# Patient Record
Sex: Female | Born: 1960 | Race: Black or African American | Hispanic: No | Smoking: Former smoker
Health system: Southern US, Community
[De-identification: ages and names within clinical notes are randomized; demographics above are authoritative.]

## PROBLEM LIST (undated history)

## (undated) DIAGNOSIS — Z5189 Encounter for other specified aftercare: Secondary | ICD-10-CM

## (undated) DIAGNOSIS — K649 Unspecified hemorrhoids: Secondary | ICD-10-CM

## (undated) DIAGNOSIS — E785 Hyperlipidemia, unspecified: Secondary | ICD-10-CM

## (undated) DIAGNOSIS — D649 Anemia, unspecified: Secondary | ICD-10-CM

## (undated) DIAGNOSIS — M1990 Unspecified osteoarthritis, unspecified site: Secondary | ICD-10-CM

## (undated) DIAGNOSIS — T7840XA Allergy, unspecified, initial encounter: Secondary | ICD-10-CM

## (undated) DIAGNOSIS — R011 Cardiac murmur, unspecified: Secondary | ICD-10-CM

## (undated) HISTORY — DX: Anemia, unspecified: D64.9

## (undated) HISTORY — PX: POLYPECTOMY: SHX149

## (undated) HISTORY — DX: Unspecified osteoarthritis, unspecified site: M19.90

## (undated) HISTORY — DX: Unspecified hemorrhoids: K64.9

## (undated) HISTORY — DX: Hyperlipidemia, unspecified: E78.5

## (undated) HISTORY — DX: Allergy, unspecified, initial encounter: T78.40XA

## (undated) HISTORY — DX: Cardiac murmur, unspecified: R01.1

## (undated) HISTORY — PX: TONSILLECTOMY: SUR1361

## (undated) HISTORY — PX: HERNIA REPAIR: SHX51

## (undated) HISTORY — DX: Encounter for other specified aftercare: Z51.89

---

## 2012-11-09 ENCOUNTER — Ambulatory Visit (INDEPENDENT_AMBULATORY_CARE_PROVIDER_SITE_OTHER): Payer: BC Managed Care – PPO | Admitting: Emergency Medicine

## 2012-11-09 VITALS — BP 126/76 | HR 80 | Temp 98.3°F | Resp 16 | Ht 68.25 in | Wt 230.6 lb

## 2012-11-09 DIAGNOSIS — M67442 Ganglion, left hand: Secondary | ICD-10-CM

## 2012-11-09 DIAGNOSIS — M6740 Ganglion, unspecified site: Secondary | ICD-10-CM

## 2012-11-09 MED ORDER — NAPROXEN SODIUM 550 MG PO TABS: 550.0000 mg | ORAL_TABLET | Freq: Two times a day (BID) | ORAL | Status: AC

## 2012-11-09 NOTE — Patient Instructions (Signed)
Ganglion Cyst °A ganglion cyst is a noncancerous, fluid-filled lump that occurs near joints or tendons. The ganglion cyst grows out of a joint or the lining of a tendon. It most often develops in the hand or wrist but can also develop in the shoulder, elbow, hip, knee, ankle, or foot. The round or oval ganglion can be pea sized or larger than a grape. Increased activity may enlarge the size of the cyst because more fluid starts to build up.  °CAUSES  °It is not completely known what causes a ganglion cyst to grow. However, it may be related to: °· Inflammation or irritation around the joint. °· An injury. °· Repetitive movements or overuse. °· Arthritis. °SYMPTOMS  °A lump most often appears in the hand or wrist, but can occur in other areas of the body. Generally, the lump is painless without other symptoms. However, sometimes pain can be felt during activity or when pressure is applied to the lump. The lump may even be tender to the touch. Tingling, pain, numbness, or muscle weakness can occur if the ganglion cyst presses on a nerve. Your grip may be weak and you may have less movement in your joints.  °DIAGNOSIS  °Ganglion cysts are most often diagnosed based on a physical exam, noting where the cyst is and how it looks. Your caregiver will feel the lump and may shine a light alongside it. If it is a ganglion, a light often shines through it. Your caregiver may order an X-ray, ultrasound, or MRI to rule out other conditions. °TREATMENT  °Ganglions usually go away on their own without treatment. If pain or other symptoms are involved, treatment may be needed. Treatment is also needed if the ganglion limits your movement or if it gets infected. Treatment options include: °· Wearing a wrist or finger brace or splint. °· Taking anti-inflammatory medicine. °· Draining fluid from the lump with a needle (aspiration). °· Injecting a steroid into the joint. °· Surgery to remove the ganglion cyst and its stalk that is  attached to the joint or tendon. However, ganglion cysts can grow back. °HOME CARE INSTRUCTIONS  °· Do not press on the ganglion, poke it with a needle, or hit it with a heavy object. You may rub the lump gently and often. Sometimes fluid moves out of the cyst. °· Only take medicines as directed by your caregiver. °· Wear your brace or splint as directed by your caregiver. °SEEK MEDICAL CARE IF:  °· Your ganglion becomes larger or more painful. °· You have increased redness, red streaks, or swelling. °· You have pus coming from the lump. °· You have weakness or numbness in the affected area. °MAKE SURE YOU:  °· Understand these instructions. °· Will watch your condition. °· Will get help right away if you are not doing well or get worse. °Document Released: 12/25/1999 Document Revised: 09/21/2011 Document Reviewed: 02/20/2007 °ExitCare® Patient Information ©2014 [employer]. ° °

## 2012-11-09 NOTE — Progress Notes (Signed)
[facility] [address] [place] [address] [phone]  Date:  11/09/2012   Name:  Marissa Rasmussen   DOB:  1960/02/12   MRN:  [id number]  PCP:  No primary provider on file.    Chief Complaint: Mass   History of Present Illness:  Marissa Rasmussen is a 52 y.o. very pleasant female patient who presents with the following:  Patient to office with pain in her left dorsal hand.  No history of trauma or overuse.  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   Has a swelling in the hand corresponding to the pain.  Woke up with pain in middle of  The night..  No improvement with over the counter medications or other home remedies. Denies other complaint or health concern today.   There are no active problems to display for this patient.   Past Medical History  Diagnosis Date  . Allergy   . Anemia   . Anxiety   . Blood transfusion without reported diagnosis     Past Surgical History  Procedure Laterality Date  . Cesarean section    . Hernia repair    . Tonsillectomy    . Polypectomy      attached to stomach wall    History  Substance Use Topics  . Smoking status: Never Smoker   . Smokeless tobacco: Not on file  . Alcohol Use: 0.0 oz/week    1-2 drink(s) per week    Family History  Problem Relation Age of Onset  . Cancer Mother     Bowel duct  . Hypertension Mother   . Stroke Mother   . Hypertension Father   . Diabetes Father   . Heart disease Father   . Heart disease Sister   . Hypertension Sister   . Cancer Maternal Grandmother   . Heart disease Paternal Grandmother   . Cancer Paternal Grandfather   . Hypertension Sister     No Known Allergies  Medication list has been reviewed and updated.  No current outpatient prescriptions on file prior to visit.   No current facility-administered medications on file prior to visit.    Review of Systems:  As per HPI, otherwise negative.    Physical Examination: Filed Vitals:   11/09/12 1003   BP: 126/76  Pulse: 80  Temp: 98.3 F (36.8 C)  Resp: 16   Filed Vitals:   11/09/12 1003  Height: 5' 8.25" (1.734 m)  Weight: 230 lb 9.6 oz (104.599 kg)   Body mass index is 34.79 kg/(m^2). Ideal Body Weight: Weight in (lb) to have BMI = 25: 165.3   GEN: WDWN, NAD, Non-toxic, Alert & Oriented x 3 HEENT: Atraumatic, Normocephalic.  Ears and Nose: No external deformity. EXTR: No clubbing/cyanosis/edema NEURO: Normal gait.  PSYCH: Normally interactive. Conversant. Not depressed or anxious appearing.  Calm demeanor.  LEFT hand:  Has tender ballotable mass on dorsal hand no ecchymosis or osseous tenderness.  FULL AROM.  NATI.  Assessment and Plan: Ganglion hand Anaprox Hand consult Splint   Signed,  Phillips Odor, MD

## 2014-03-05 ENCOUNTER — Ambulatory Visit (INDEPENDENT_AMBULATORY_CARE_PROVIDER_SITE_OTHER): Payer: BC Managed Care – PPO | Admitting: Family Medicine

## 2014-03-05 ENCOUNTER — Encounter: Payer: Self-pay | Admitting: Family Medicine

## 2014-03-05 VITALS — BP 122/70 | HR 84 | Temp 97.7°F | Resp 16 | Ht 68.0 in | Wt 240.6 lb

## 2014-03-05 DIAGNOSIS — Z1159 Encounter for screening for other viral diseases: Secondary | ICD-10-CM

## 2014-03-05 DIAGNOSIS — Z124 Encounter for screening for malignant neoplasm of cervix: Secondary | ICD-10-CM

## 2014-03-05 DIAGNOSIS — M25561 Pain in right knee: Secondary | ICD-10-CM

## 2014-03-05 DIAGNOSIS — K642 Third degree hemorrhoids: Secondary | ICD-10-CM

## 2014-03-05 DIAGNOSIS — N6012 Diffuse cystic mastopathy of left breast: Secondary | ICD-10-CM

## 2014-03-05 DIAGNOSIS — Z13220 Encounter for screening for lipoid disorders: Secondary | ICD-10-CM

## 2014-03-05 DIAGNOSIS — N898 Other specified noninflammatory disorders of vagina: Secondary | ICD-10-CM

## 2014-03-05 DIAGNOSIS — N6011 Diffuse cystic mastopathy of right breast: Secondary | ICD-10-CM

## 2014-03-05 DIAGNOSIS — M25562 Pain in left knee: Secondary | ICD-10-CM

## 2014-03-05 DIAGNOSIS — Z1212 Encounter for screening for malignant neoplasm of rectum: Secondary | ICD-10-CM

## 2014-03-05 DIAGNOSIS — E669 Obesity, unspecified: Secondary | ICD-10-CM | POA: Insufficient documentation

## 2014-03-05 DIAGNOSIS — J309 Allergic rhinitis, unspecified: Secondary | ICD-10-CM | POA: Insufficient documentation

## 2014-03-05 DIAGNOSIS — Z01419 Encounter for gynecological examination (general) (routine) without abnormal findings: Secondary | ICD-10-CM

## 2014-03-05 DIAGNOSIS — Z113 Encounter for screening for infections with a predominantly sexual mode of transmission: Secondary | ICD-10-CM

## 2014-03-05 DIAGNOSIS — Z0000 Encounter for general adult medical examination without abnormal findings: Secondary | ICD-10-CM

## 2014-03-05 DIAGNOSIS — Z1211 Encounter for screening for malignant neoplasm of colon: Secondary | ICD-10-CM

## 2014-03-05 LAB — CBC WITH DIFFERENTIAL/PLATELET
BASOS ABS: 0 10*3/uL (ref 0.0–0.1)
BASOS PCT: 0 % (ref 0–1)
EOS ABS: 0.1 10*3/uL (ref 0.0–0.7)
Eosinophils Relative: 1 % (ref 0–5)
HCT: 42.4 % (ref 36.0–46.0)
Hemoglobin: 14.4 g/dL (ref 12.0–15.0)
LYMPHS PCT: 32 % (ref 12–46)
Lymphs Abs: 2.5 10*3/uL (ref 0.7–4.0)
MCH: 28.1 pg (ref 26.0–34.0)
MCHC: 34 g/dL (ref 30.0–36.0)
MCV: 82.8 fL (ref 78.0–100.0)
MONO ABS: 0.5 10*3/uL (ref 0.1–1.0)
MPV: 11.7 fL (ref 8.6–12.4)
Monocytes Relative: 7 % (ref 3–12)
Neutro Abs: 4.6 10*3/uL (ref 1.7–7.7)
Neutrophils Relative %: 60 % (ref 43–77)
PLATELETS: 357 10*3/uL (ref 150–400)
RBC: 5.12 MIL/uL — AB (ref 3.87–5.11)
RDW: 13.5 % (ref 11.5–15.5)
WBC: 7.7 10*3/uL (ref 4.0–10.5)

## 2014-03-05 LAB — TSH: TSH: 1.144 u[IU]/mL (ref 0.350–4.500)

## 2014-03-05 LAB — COMPLETE METABOLIC PANEL WITH GFR
ALK PHOS: 114 U/L (ref 39–117)
ALT: 16 U/L (ref 0–35)
AST: 15 U/L (ref 0–37)
Albumin: 4.5 g/dL (ref 3.5–5.2)
BILIRUBIN TOTAL: 0.6 mg/dL (ref 0.2–1.2)
BUN: 14 mg/dL (ref 6–23)
CO2: 19 mEq/L (ref 19–32)
CREATININE: 1.11 mg/dL — AB (ref 0.50–1.10)
Calcium: 9.8 mg/dL (ref 8.4–10.5)
Chloride: 108 mEq/L (ref 96–112)
GFR, EST AFRICAN AMERICAN: 66 mL/min
GFR, EST NON AFRICAN AMERICAN: 57 mL/min — AB
Glucose, Bld: 87 mg/dL (ref 70–99)
POTASSIUM: 4.3 meq/L (ref 3.5–5.3)
Sodium: 142 mEq/L (ref 135–145)
TOTAL PROTEIN: 7.8 g/dL (ref 6.0–8.3)

## 2014-03-05 LAB — LIPID PANEL
CHOLESTEROL: 248 mg/dL — AB (ref 0–200)
HDL: 37 mg/dL — ABNORMAL LOW (ref >=46)
LDL Cholesterol: 179 mg/dL — ABNORMAL HIGH (ref 0–99)
Total CHOL/HDL Ratio: 6.7 Ratio
Triglycerides: 162 mg/dL — ABNORMAL HIGH (ref <150)
VLDL: 32 mg/dL (ref 0–40)

## 2014-03-05 LAB — RPR

## 2014-03-05 LAB — POCT URINALYSIS DIPSTICK
BILIRUBIN UA: NEGATIVE
GLUCOSE UA: NEGATIVE
Ketones, UA: NEGATIVE
LEUKOCYTES UA: NEGATIVE
Nitrite, UA: NEGATIVE
PH UA: 5
Protein, UA: NEGATIVE
RBC UA: NEGATIVE
Spec Grav, UA: 1.025
UROBILINOGEN UA: 0.2

## 2014-03-05 LAB — POCT WET PREP WITH KOH
KOH Prep POC: NEGATIVE
RBC Wet Prep HPF POC: NEGATIVE
TRICHOMONAS UA: NEGATIVE
Yeast Wet Prep HPF POC: NEGATIVE

## 2014-03-05 MED ORDER — LORATADINE 10 MG PO TABS: 10.0000 mg | ORAL_TABLET | Freq: Every day | ORAL | Status: DC

## 2014-03-05 MED ORDER — METRONIDAZOLE 500 MG PO TABS: 500.0000 mg | ORAL_TABLET | Freq: Two times a day (BID) | ORAL | Status: DC

## 2014-03-05 MED ORDER — IBUPROFEN 200 MG PO TABS: ORAL_TABLET | ORAL | Status: AC

## 2014-03-05 MED ORDER — FLUTICASONE PROPIONATE 50 MCG/ACT NA SUSP: 2.0000 | Freq: Every day | NASAL | Status: DC

## 2014-03-05 NOTE — Patient Instructions (Addendum)
Keeping You Healthy  Get These Tests  Blood Pressure- Have your blood pressure checked by your healthcare provider at least once a year.  Normal blood pressure is 120/80.  Weight- Have your body mass index (BMI) calculated to screen for obesity.  BMI is a measure of body fat based on height and weight.  You can calculate your own BMI at GravelBags.it  Cholesterol- Have your cholesterol checked every year.  Diabetes- Have your blood sugar checked every year if you have high blood pressure, high cholesterol, a family history of diabetes or if you are overweight.  Pap Smear- Have a pap smear every 1 to 3 years if you have been sexually active.  If you are older than 65 and recent pap smears have been normal you may not need additional pap smears.  In addition, if you have had a hysterectomy  For benign disease additional pap smears are not necessary.  Mammogram-Yearly mammograms are essential for early detection of breast cancer  Screening for Colon Cancer- Colonoscopy starting at age 66. Screening may begin sooner depending on your family history and other health conditions.  Follow up colonoscopy as directed by your Gastroenterologist.  Screening for Osteoporosis- Screening begins at age 53 with bone density scanning, sooner if you are at higher risk for developing Osteoporosis.  Get these medicines  Calcium with Vitamin D- Your body requires 1200-1500 mg of Calcium a day and [phone] IU of Vitamin D a day.  You can only absorb 500 mg of Calcium at a time therefore Calcium must be taken in 2 or 3 separate doses throughout the day.  Hormones- Hormone therapy has been associated with increased risk for certain cancers and heart disease.  Talk to your healthcare provider about if you need relief from menopausal symptoms.  Aspirin- Ask your healthcare provider about taking Aspirin to prevent Heart Disease and Stroke.  Get these Immuniztions  Flu shot- Every fall You declined this  vaccine due to adverse reaction in the past.  Pneumonia shot- Once after the age of 69; if you are younger ask your healthcare provider if you need a pneumonia shot.  Tetanus- Every ten years. Next due- year 2022.  Zostavax- Once after the age of 74 to prevent shingles.  Take these steps  Don't smoke- Your healthcare provider can help you quit. For tips on how to quit, ask your healthcare provider or go to www.smokefree.gov or call 1-800 QUIT-NOW.  Be physically active- Exercise 5 days a week for a minimum of 30 minutes.  If you are not already physically active, start slow and gradually work up to 30 minutes of moderate physical activity.  Try walking, dancing, bike riding, swimming, etc.  Eat a healthy diet- Eat a variety of healthy foods such as fruits, vegetables, whole grains, low fat milk, low fat cheeses, yogurt, lean meats, chicken, fish, eggs, dried beans, tofu, etc.  For more information go to www.thenutritionsource.org  Dental visit- Brush and floss teeth twice daily; visit your dentist twice a year.  Eye exam- Visit your Optometrist or Ophthalmologist yearly.  Drink alcohol in moderation- Limit alcohol intake to one drink or less a day.  Never drink and drive.  Depression- Your emotional health is as important as your physical health.  If you're feeling down or losing interest in things you normally enjoy, please talk to your healthcare provider.  Seat Belts- can save your life; always wear one  Smoke/Carbon Monoxide detectors- These detectors need to be installed on the appropriate level  of your home.  Replace batteries at least once a year.  Violence- If anyone is threatening or hurting you, please tell your healthcare provider.  Living Will/ Health care power of attorney- Discuss with your healthcare provider and family.    Knee Pain The knee is the complex joint between your thigh and your lower leg. It is made up of bones, tendons, ligaments, and cartilage. The  bones that make up the knee are:  The femur in the thigh.  The tibia and fibula in the lower leg.  The patella or kneecap riding in the groove on the lower femur. CAUSES  Knee pain is a common complaint with many causes. A few of these causes are:  Injury, such as:  A ruptured ligament or tendon injury.  Torn cartilage.  Medical conditions, such as:  Gout  Arthritis  Infections  Overuse, over training, or overdoing a physical activity. Knee pain can be minor or severe. Knee pain can accompany debilitating injury. Minor knee problems often respond well to self-care measures or get well on their own. More serious injuries may need medical intervention or even surgery. SYMPTOMS The knee is complex. Symptoms of knee problems can vary widely. Some of the problems are:  Pain with movement and weight bearing.  Swelling and tenderness.  Buckling of the knee.  Inability to straighten or extend your knee.  Your knee locks and you cannot straighten it.  Warmth and redness with pain and fever.  Deformity or dislocation of the kneecap. DIAGNOSIS  Determining what is wrong may be very straight forward such as when there is an injury. It can also be challenging because of the complexity of the knee. Tests to make a diagnosis may include:  Your caregiver taking a history and doing a physical exam.  Routine X-rays can be used to rule out other problems. X-rays will not reveal a cartilage tear. Some injuries of the knee can be diagnosed by:  Arthroscopy a surgical technique by which a small video camera is inserted through tiny incisions on the sides of the knee. This procedure is used to examine and repair internal knee joint problems. Tiny instruments can be used during arthroscopy to repair the torn knee cartilage (meniscus).  Arthrography is a radiology technique. A contrast liquid is directly injected into the knee joint. Internal structures of the knee joint then become visible  on X-ray film.  An MRI scan is a non X-ray radiology procedure in which magnetic fields and a computer produce two- or three-dimensional images of the inside of the knee. Cartilage tears are often visible using an MRI scanner. MRI scans have largely replaced arthrography in diagnosing cartilage tears of the knee.  Blood work.  Examination of the fluid that helps to lubricate the knee joint (synovial fluid). This is done by taking a sample out using a needle and a syringe. TREATMENT The treatment of knee problems depends on the cause. Some of these treatments are:  Depending on the injury, proper casting, splinting, surgery, or physical therapy care will be needed.  Give yourself adequate recovery time. Do not overuse your joints. If you begin to get sore during workout routines, back off. Slow down or do fewer repetitions.  For repetitive activities such as cycling or running, maintain your strength and nutrition.  Alternate muscle groups. For example, if you are a weight lifter, work the upper body on one day and the lower body the next.  Either tight or weak muscles do not give the  proper support for your knee. Tight or weak muscles do not absorb the stress placed on the knee joint. Keep the muscles surrounding the knee strong.  Take care of mechanical problems.  If you have flat feet, orthotics or special shoes may help. See your caregiver if you need help.  Arch supports, sometimes with wedges on the inner or outer aspect of the heel, can help. These can shift pressure away from the side of the knee most bothered by osteoarthritis.  A brace called an "unloader" brace also may be used to help ease the pressure on the most arthritic side of the knee.  If your caregiver has prescribed crutches, braces, wraps or ice, use as directed. The acronym for this is PRICE. This means protection, rest, ice, compression, and elevation.  Nonsteroidal anti-inflammatory drugs (NSAIDs), can help  relieve pain. But if taken immediately after an injury, they may actually increase swelling. Take NSAIDs with food in your stomach. Stop them if you develop stomach problems. Do not take these if you have a history of ulcers, stomach pain, or bleeding from the bowel. Do not take without your caregiver's approval if you have problems with fluid retention, heart failure, or kidney problems.  For ongoing knee problems, physical therapy may be helpful.  Glucosamine and chondroitin are over-the-counter dietary supplements. Both may help relieve the pain of osteoarthritis in the knee. These medicines are different from the usual anti-inflammatory drugs. Glucosamine may decrease the rate of cartilage destruction.  Injections of a corticosteroid drug into your knee joint may help reduce the symptoms of an arthritis flare-up. They may provide pain relief that lasts a few months. You may have to wait a few months between injections. The injections do have a small increased risk of infection, water retention, and elevated blood sugar levels.  Hyaluronic acid injected into damaged joints may ease pain and provide lubrication. These injections may work by reducing inflammation. A series of shots may give relief for as long as 6 months.  Topical painkillers. Applying certain ointments to your skin may help relieve the pain and stiffness of osteoarthritis. Ask your pharmacist for suggestions. Many over the-counter products are approved for temporary relief of arthritis pain.  In some countries, doctors often prescribe topical NSAIDs for relief of chronic conditions such as arthritis and tendinitis. A review of treatment with NSAID creams found that they worked as well as oral medications but without the serious side effects. PREVENTION  Maintain a healthy weight. Extra pounds put more strain on your joints.  Get strong, stay limber. Weak muscles are a common cause of knee injuries. Stretching is important. Include  flexibility exercises in your workouts.  Be smart about exercise. If you have osteoarthritis, chronic knee pain or recurring injuries, you may need to change the way you exercise. This does not mean you have to stop being active. If your knees ache after jogging or playing basketball, consider switching to swimming, water aerobics, or other low-impact activities, at least for a few days a week. Sometimes limiting high-impact activities will provide relief.  Make sure your shoes fit well. Choose footwear that is right for your sport.  Protect your knees. Use the proper gear for knee-sensitive activities. Use kneepads when playing volleyball or laying carpet. Buckle your seat belt every time you drive. Most shattered kneecaps occur in car accidents.  Rest when you are tired. SEEK MEDICAL CARE IF:  You have knee pain that is continual and does not seem to be getting better.  SEEK IMMEDIATE MEDICAL CARE IF:  Your knee joint feels hot to the touch and you have a high fever. MAKE SURE YOU:   Understand these instructions.  Will watch your condition.  Will get help right away if you are not doing well or get worse. Document Released: 10/24/2006 Document Revised: 03/21/2011 Document Reviewed: 10/24/2006 [facility] Patient Information 2015 [place], [place]. This information is not intended to replace advice given to you by your health care provider. Make sure you discuss any questions you have with your health care provider.    Bacterial Vaginosis Bacterial vaginosis is a vaginal infection that occurs when the normal balance of bacteria in the vagina is disrupted. It results from an overgrowth of certain bacteria. This is the most common vaginal infection in women of childbearing age. Treatment is important to prevent complications, especially in pregnant women, as it can cause a premature delivery. CAUSES  Bacterial vaginosis is caused by an increase in harmful bacteria that are normally present in  smaller amounts in the vagina. Several different kinds of bacteria can cause bacterial vaginosis. However, the reason that the condition develops is not fully understood. RISK FACTORS Certain activities or behaviors can put you at an increased risk of developing bacterial vaginosis, including:  Having a new sex partner or multiple sex partners.  Douching.  Using an intrauterine device (IUD) for contraception. Women do not get bacterial vaginosis from toilet seats, bedding, swimming pools, or contact with objects around them. SIGNS AND SYMPTOMS  Some women with bacterial vaginosis have no signs or symptoms. Common symptoms include:  Grey vaginal discharge.  A fishlike odor with discharge, especially after sexual intercourse.  Itching or burning of the vagina and vulva.  Burning or pain with urination. DIAGNOSIS  Your health care provider will take a medical history and examine the vagina for signs of bacterial vaginosis. A sample of vaginal fluid may be taken. Your health care provider will look at this sample under a microscope to check for bacteria and abnormal cells. A vaginal pH test may also be done.  TREATMENT  Bacterial vaginosis may be treated with antibiotic medicines. These may be given in the form of a pill or a vaginal cream. A second round of antibiotics may be prescribed if the condition comes back after treatment.  HOME CARE INSTRUCTIONS   Only take over-the-counter or prescription medicines as directed by your health care provider.  If antibiotic medicine was prescribed, take it as directed. Make sure you finish it even if you start to feel better.  Do not have sex until treatment is completed.  Tell all sexual partners that you have a vaginal infection. They should see their health care provider and be treated if they have problems, such as a mild rash or itching.  Practice safe sex by using condoms and only having one sex partner. SEEK MEDICAL CARE IF:   Your  symptoms are not improving after 3 days of treatment.  You have increased discharge or pain.  You have a fever. MAKE SURE YOU:   Understand these instructions.  Will watch your condition.  Will get help right away if you are not doing well or get worse. FOR MORE INFORMATION  Centers for Disease Control and Prevention, Division of STD Prevention: [employer]raiserFraud[employer] American Sexual Health Association (ASHA): www.ashastd.org  Document Released: 12/27/2004 Document Revised: 10/17/2012 Document Reviewed: 08/08/2012 [facility] Patient Information 2015 [place], [place]. This information is not intended to replace advice given to you by your health care provider. Make sure  you discuss any questions you have with your health care provider.

## 2014-03-05 NOTE — Progress Notes (Signed)
Subjective:    Patient ID: Marissa Rasmussen, female    DOB: 1960-12-04, 54 y.o.   MRN: [id number]  HPI  This 54 y.o. Female is here to establish care; last "Well Woman" exam was in 2014 in [place], [place]. She requests CPE/PAP and preventative care as well as referral for evaluation of chronic hemorrhoids at a center in [place], [place]. Pt has hx of fibrocystic breast changes and fibroid uterus.  She has 15-month hx of bilateral knee pain, R >> L. She had flare up when she tried to start a walk/run program. Pain accompanied by swelling and popping and sensation of "tear" in R knee. Some temporary relief w/ Advill 2 tabs every 4-6 hours for several days. Still had trouble climbing stairs and has not resumed walking for exercise. Requests letter/"prescription" for exercise so that will be covered by [employer] account; she wants to try water aerobics class.  HCM: PAP/MMG- As noted above.           CRS- Requests referral.            IMM- Declines Flu vaccine due to adverse reaction in the past.            Vision- Annually.            Dental- Currently in treatment.  Patient Active Problem List   Diagnosis Date Noted  . Third degree hemorrhoids 03/05/2014  . Fibrocystic breast changes of both breasts 03/05/2014  . Knee pain, bilateral 03/05/2014  . Obesity 03/05/2014  . Allergic rhinitis 03/05/2014    Prior to Admission medications   Medication Sig Start Date End Date Taking? Authorizing Provider  B Complex Vitamins (VITAMIN B COMPLEX PO) Take by mouth daily.   Yes Historical Provider, MD  Triamcinolone Acetonide (NASACORT AQ NA) Place 50 mcg into the nose. Nasal mist spray   Yes Historical Provider, MD  ibuprofen (ADVIL) 200 MG tablet Take 2 tablets by mouth every 6-8 hours prn pain.      loratadine (CLARITIN) 10 MG tablet Take 1 tablet (10 mg total) by mouth daily.       Past Surgical History  Procedure Laterality Date  . Hernia repair    . Tonsillectomy    . Polypectomy      attached to  stomach wall  . Cesarean section      per patient's [facility] - 2 cesarean sect    History   Social History  . Marital Status: Divorced    Spouse Name: N/A  . Number of Children: N/A  . Years of Education: N/A   Occupational History  . human resources    Social History Main Topics  . Smoking status: Never Smoker   . Smokeless tobacco: Not on file  . Alcohol Use: 0.0 oz/week    1-2 Standard drinks or equivalent per week  . Drug Use: No  . Sexual Activity: Not on file   Other Topics Concern  . Not on file   Social History Narrative    Family History  Problem Relation Age of Onset  . Cancer Mother     Bowel duct  . Hypertension Mother   . Stroke Mother   . Hyperlipidemia Mother   . Hypertension Father   . Diabetes Father   . Heart disease Father   . Hyperlipidemia Father   . Heart disease Sister   . Hypertension Sister   . Cancer Maternal Grandmother   . Heart disease Paternal Grandmother   . Cancer Paternal  Grandfather   . Hypertension Sister     Review of Systems  Constitutional: Positive for diaphoresis and activity change.       Night sweats due to menopause.  HENT: Positive for ear pain and sinus pressure.        Chronic allergies.  Eyes: Negative.        Wears corrective lenses; has appt today.  Respiratory: Negative.   Cardiovascular: Negative.   Gastrointestinal: Positive for abdominal pain, constipation, anal bleeding and rectal pain.       Associated w/ hemorrhoids.  Endocrine: Negative.   Genitourinary: Negative.        Menopause x 2 years.  Musculoskeletal: Positive for joint swelling and arthralgias. Negative for back pain and gait problem.       Knees. No hx of trauma.  Skin: Negative.   Allergic/Immunologic: Positive for environmental allergies.  Neurological: Negative.   Hematological: Negative.   Psychiatric/Behavioral: Negative.        Objective:   Physical Exam  Constitutional: She is oriented to person, place,  and time. Vital signs are normal. She appears well-developed and well-nourished. No distress.  Blood pressure 122/70, pulse 84, temperature 97.7 F (36.5 C), temperature source Oral, resp. rate 16, height 5\' 8"  (1.727 m), weight 240 lb 9.6 oz (109.135 kg), SpO2 98 %.   HENT:  Head: Normocephalic and atraumatic.  Right Ear: Hearing, tympanic membrane, external ear and ear canal normal.  Left Ear: Hearing, tympanic membrane, external ear and ear canal normal.  Nose: Nose normal. No nasal deformity or septal deviation.  Mouth/Throat: Uvula is midline, oropharynx is clear and moist and mucous membranes are normal. No oral lesions. Normal dentition.  Has popping in TMJs.  Eyes: Conjunctivae, EOM and lids are normal. Pupils are equal, round, and reactive to light. No scleral icterus.  Neck: Trachea normal, normal range of motion, full passive range of motion without pain and phonation normal. Neck supple. No spinous process tenderness and no muscular tenderness present. No thyroid mass and no thyromegaly present.  Cardiovascular: Normal rate, regular rhythm, S1 normal, S2 normal, normal heart sounds, intact distal pulses and normal pulses.   No extrasystoles are present. PMI is not displaced.  Exam reveals no gallop and no friction rub.   No murmur heard. Pulmonary/Chest: Effort normal and breath sounds normal. No respiratory distress. Right breast exhibits no inverted nipple, no mass, no nipple discharge, no skin change and no tenderness. Left breast exhibits no inverted nipple, no mass, no nipple discharge, no skin change and no tenderness. Breasts are symmetrical.  Abdominal: Soft. Normal appearance and bowel sounds are normal. She exhibits no distension and no mass. There is no hepatosplenomegaly. There is no tenderness. There is no guarding and no CVA tenderness.  Genitourinary: Rectal exam shows external hemorrhoid. There is no rash, tenderness or lesion on the right labia. There is no rash,  tenderness or lesion on the left labia. Uterus is enlarged. Uterus is not fixed and not tender. Cervix exhibits discharge. Cervix exhibits no motion tenderness and no friability. Right adnexum displays no mass, no tenderness and no fullness. Left adnexum displays no mass, no tenderness and no fullness. No erythema, tenderness or bleeding in the vagina. Vaginal discharge found.  Musculoskeletal:       Right shoulder: Normal.       Left shoulder: Normal.       Right knee: She exhibits decreased range of motion. She exhibits no swelling, no effusion, no deformity, normal alignment, no LCL laxity,  normal patellar mobility and normal meniscus.       Left knee: She exhibits decreased range of motion. She exhibits no swelling, no effusion, no deformity, normal alignment, normal patellar mobility and normal meniscus. No tenderness found.       Cervical back: Normal.       Thoracic back: Normal.       Lumbar back: Normal.  Mildly + patella compression test. McMurray's negative.  Lymphadenopathy:       Head (right side): No submental, no submandibular, no tonsillar, no preauricular, no posterior auricular and no occipital adenopathy present.       Head (left side): No submental, no submandibular, no tonsillar, no preauricular, no posterior auricular and no occipital adenopathy present.    She has no cervical adenopathy.    She has no axillary adenopathy.       Right: No inguinal and no supraclavicular adenopathy present.       Left: No inguinal and no supraclavicular adenopathy present.  Neurological: She is alert and oriented to person, place, and time. She has normal strength and normal reflexes. She displays no atrophy. No cranial nerve deficit or sensory deficit. She exhibits normal muscle tone. Coordination and gait normal.  Skin: Skin is warm, dry and intact. No ecchymosis, no lesion and no rash noted. She is not diaphoretic. No cyanosis or erythema. Nails show no clubbing.  Psychiatric: She has a  normal mood and affect. Her speech is normal. Judgment and thought content normal. Cognition and memory are normal.  Nursing note and vitals reviewed.   Results for orders placed or performed in visit on 03/05/14  POCT Wet Prep with KOH  Result Value Ref Range   Trichomonas, UA Negative    Clue Cells Wet Prep HPF POC tntc    Epithelial Wet Prep HPF POC tntc    Yeast Wet Prep HPF POC neg    Bacteria Wet Prep HPF POC 5+    RBC Wet Prep HPF POC neg    WBC Wet Prep HPF POC tntc    KOH Prep POC Negative   POCT urinalysis dipstick  Result Value Ref Range   Color, UA yellow    Clarity, UA clear    Glucose, UA neg    Bilirubin, UA neg    Ketones, UA neg    Spec Grav, UA 1.025    Blood, UA neg    pH, UA 5.0    Protein, UA neg    Urobilinogen, UA 0.2    Nitrite, UA neg    Leukocytes, UA Negative        Assessment & Plan:  Encounter for health maintenance examination - Plan: TSH, POCT urinalysis dipstick  Encounter for cervical Pap smear with pelvic exam - Plan: Pap IG, CT/NG w/ reflex HPV when ASC-U  Third degree hemorrhoids - Plan: Ambulatory referral to General Surgery (pt requests a practice in [place]) Plan: COMPLETE METABOLIC PANEL WITH GFR  Vaginal discharge - Bacterial vaginosis- RX: Metronidazole 500 mg 1 tab bid x 7 days. Plan: POCT Wet Prep with KOH, POCT urinalysis dipstick  Arthralgia of both knees - Continue Advil 2 tabs every 6-8 hours; letter provided to pt for participation in Aquatic exercise class. Need weight reduction. If pain persists such that exercise is still limited, RTC for xrays. Plan: COMPLETE METABOLIC PANEL WITH GFR, CBC with Differential/Platelet  Need for hepatitis C screening test - Plan: Hepatitis C Ab Reflex HCV RNA, QUANT  Screening for STDs (sexually transmitted  diseases) - Plan: RPR, HIV antibody (with reflex)  Encounter for colorectal cancer screening - Plan: Ambulatory referral to Gastroenterology  Fibrocystic  breast changes of both breasts - Plan: MM Digital Screening, COMPLETE METABOLIC PANEL WITH GFR  Lipid screening - Plan: Lipid panel  Meds ordered this encounter  Medications  . ibuprofen (ADVIL) 200 MG tablet    Sig: Take 2 tablets by mouth every 6-8 hours prn pain.    Dispense:  100 tablet    Refill:  1  . loratadine (CLARITIN) 10 MG tablet    Sig: Take 1 tablet (10 mg total) by mouth daily.    Dispense:  30 tablet    Refill:  5  . fluticasone (FLONASE) 50 MCG/ACT nasal spray    Sig: Place 2 sprays into both nostrils daily.    Dispense:  16 g    Refill:  6  . metroNIDAZOLE (FLAGYL) 500 MG tablet    Sig: Take 1 tablet (500 mg total) by mouth 2 (two) times daily.    Dispense:  14 tablet    Refill:  0

## 2014-03-06 LAB — HEPATITIS C ANTIBODY: HCV AB: NEGATIVE

## 2014-03-06 LAB — HIV ANTIBODY (ROUTINE TESTING W REFLEX): HIV: NONREACTIVE

## 2014-03-07 ENCOUNTER — Telehealth: Payer: Self-pay | Admitting: Gastroenterology

## 2014-03-07 LAB — PAP IG, CT-NG, RFX HPV ASCU
Chlamydia Probe Amp: NEGATIVE
GC PROBE AMP: NEGATIVE

## 2014-03-07 NOTE — Telephone Encounter (Signed)
A user error has taken place.

## 2014-03-11 LAB — HUMAN PAPILLOMAVIRUS, HIGH RISK: HPV DNA High Risk: NOT DETECTED

## 2014-04-02 ENCOUNTER — Encounter: Payer: Self-pay | Admitting: Family Medicine

## 2014-04-24 ENCOUNTER — Other Ambulatory Visit: Payer: Self-pay | Admitting: Family Medicine

## 2014-04-28 ENCOUNTER — Telehealth: Payer: BC Managed Care – PPO | Admitting: Family

## 2014-04-28 DIAGNOSIS — N760 Acute vaginitis: Secondary | ICD-10-CM

## 2014-04-28 DIAGNOSIS — B9689 Other specified bacterial agents as the cause of diseases classified elsewhere: Secondary | ICD-10-CM

## 2014-04-28 MED ORDER — METRONIDAZOLE 500 MG PO TABS: 500.0000 mg | ORAL_TABLET | Freq: Two times a day (BID) | ORAL | Status: DC

## 2014-04-28 NOTE — Progress Notes (Signed)

## 2014-04-29 ENCOUNTER — Ambulatory Visit (AMBULATORY_SURGERY_CENTER): Payer: Self-pay | Admitting: *Deleted

## 2014-04-29 VITALS — Ht 68.0 in | Wt 240.8 lb

## 2014-04-29 DIAGNOSIS — Z1211 Encounter for screening for malignant neoplasm of colon: Secondary | ICD-10-CM

## 2014-04-29 NOTE — Progress Notes (Signed)
No egg or soy allergy  No anesthesia or intubation problems per pt  No diet medications taken  Registered in [place]  Pt has no "funds available til 05-09-14"- Suprep sample given

## 2014-05-01 LAB — HM MAMMOGRAPHY

## 2014-05-06 ENCOUNTER — Ambulatory Visit (AMBULATORY_SURGERY_CENTER): Payer: BC Managed Care – PPO | Admitting: Internal Medicine

## 2014-05-06 ENCOUNTER — Encounter: Payer: Self-pay | Admitting: Internal Medicine

## 2014-05-06 VITALS — BP 136/73 | HR 59 | Temp 97.6°F | Resp 13 | Ht 68.0 in | Wt 240.0 lb

## 2014-05-06 DIAGNOSIS — D123 Benign neoplasm of transverse colon: Secondary | ICD-10-CM | POA: Diagnosis not present

## 2014-05-06 DIAGNOSIS — Z1211 Encounter for screening for malignant neoplasm of colon: Secondary | ICD-10-CM

## 2014-05-06 MED ORDER — SODIUM CHLORIDE 0.9 % IV SOLN: 500.0000 mL | INTRAVENOUS | Status: DC

## 2014-05-06 NOTE — Progress Notes (Signed)
No problems noted in the recovery room. maw 

## 2014-05-06 NOTE — Patient Instructions (Signed)
YOU HAD AN ENDOSCOPIC PROCEDURE TODAY AT THE [facility]:   Refer to the procedure report that was given to you for any specific questions about what was found during the examination.  If the procedure report does not answer your questions, please call your gastroenterologist to clarify.  If you requested that your care partner not be given the details of your procedure findings, then the procedure report has been included in a sealed envelope for you to review at your convenience later.  YOU SHOULD EXPECT: Some feelings of bloating in the abdomen. Passage of more gas than usual.  Walking can help get rid of the air that was put into your GI tract during the procedure and reduce the bloating. If you had a lower endoscopy (such as a colonoscopy or flexible sigmoidoscopy) you may notice spotting of blood in your stool or on the toilet paper. If you underwent a bowel prep for your procedure, you may not have a normal bowel movement for a few days.  Please Note:  You might notice some irritation and congestion in your nose or some drainage.  This is from the oxygen used during your procedure.  There is no need for concern and it should clear up in a day or so.  SYMPTOMS TO REPORT IMMEDIATELY:   Following lower endoscopy (colonoscopy or flexible sigmoidoscopy):  Excessive amounts of blood in the stool  Significant tenderness or worsening of abdominal pains  Swelling of the abdomen that is new, acute  Fever of 100F or higher   For urgent or emergent issues, a gastroenterologist can be reached at any hour by calling [phone].   DIET: Your first meal following the procedure should be a small meal and then it is ok to progress to your normal diet. Heavy or fried foods are harder to digest and may make you feel nauseous or bloated.  Likewise, meals heavy in dairy and vegetables can increase bloating.  Drink plenty of fluids but you should avoid alcoholic beverages for 24  hours.  ACTIVITY:  You should plan to take it easy for the rest of today and you should NOT DRIVE or use heavy machinery until tomorrow (because of the sedation medicines used during the test).    FOLLOW UP: Our staff will call the number listed on your records the next business day following your procedure to check on you and address any questions or concerns that you may have regarding the information given to you following your procedure. If we do not reach you, we will leave a message.  However, if you are feeling well and you are not experiencing any problems, there is no need to return our call.  We will assume that you have returned to your regular daily activities without incident.  If any biopsies were taken you will be contacted by phone or by letter within the next 1-3 weeks.  Please call us at [phone] if you have not heard about the biopsies in 3 weeks.    SIGNATURES/CONFIDENTIALITY: You and/or your care partner have signed paperwork which will be entered into your electronic medical record.  These signatures attest to the fact that that the information above on your After Visit Summary has been reviewed and is understood.  Full responsibility of the confidentiality of this discharge information lies with you and/or your care-partner.   Handouts were given to your care partner on polyps, hemorrhoids, and a high fiber diet with liberal fluid intake. You may resume your   current medications today. Await biopsy results. Please call if any questions or concerns.   

## 2014-05-06 NOTE — Op Note (Signed)
[place]  [address] [place], [id number]   COLONOSCOPY PROCEDURE REPORT  PATIENT: Marissa Rasmussen, Marissa Rasmussen  MR#: [id number] BIRTHDATE: 12-19-1960 , 43  yrs. old GENDER: female ENDOSCOPIST: Jerene Bears, MD REFERRED EU:[id number] McPherson, M.D. PROCEDURE DATE:  05/06/2014 PROCEDURE:   Colonoscopy with snare polypectomy First Screening Colonoscopy - Avg.  risk and is 50 yrs.  old or older Yes.  Prior Negative Screening - Now for repeat screening. N/A  History of Adenoma - Now for follow-up colonoscopy & has been > or = to 3 yrs.  N/A ASA CLASS:   Class II INDICATIONS:Screening for colonic neoplasia and Colorectal Neoplasm Risk Assessment for this procedure is average risk. MEDICATIONS: Monitored anesthesia care and Propofol 250 mg IV  DESCRIPTION OF PROCEDURE:   After the risks benefits and alternatives of the procedure were thoroughly explained, informed consent was obtained.  The digital rectal exam revealed several skin tags.   The LB [id number]  endoscope was introduced through the anus and advanced to the cecum, which was identified by both the appendix and ileocecal valve. No adverse events experienced.   The quality of the prep was excellent.  (Suprep was used)  The instrument was then slowly withdrawn as the colon was fully examined.   COLON FINDINGS: Two sessile polyps ranging between 3-39mm in size were found in the proximal transverse colon.  Polypectomies were performed with a cold snare.  The resection was complete, the polyp tissue was completely retrieved and sent to histology.   The examination was otherwise normal.  Retroflexed views revealed external hemorrhoids. The time to cecum = 3.6 Withdrawal time = 11.6   The scope was withdrawn and the procedure completed. COMPLICATIONS: There were no immediate complications.  ENDOSCOPIC IMPRESSION: 1.   Two sessile polyps ranging between 3-66mm in size were found in the proximal transverse colon;  polypectomies were performed with a cold snare 2.   The examination was otherwise normal  RECOMMENDATIONS: 1.  Await pathology results 2.  If the polyps removed today are proven to be adenomatous (pre-cancerous) polyps, you will need a repeat colonoscopy in 5 years.  Otherwise you should continue to follow colorectal cancer screening guidelines for "routine risk" patients with colonoscopy in 10 years.  You will receive a letter within 1-2 weeks with the results of your biopsy as well as final recommendations.  Please call my office if you have not received a letter after 3 weeks.  eSigned:  Jerene Bears, MD 05/06/2014 10:33 AM   cc: Ellsworth Lennox, MD and The Patient

## 2014-05-06 NOTE — Progress Notes (Signed)
Report to PACU, RN, vss, BBS= Clear.  

## 2014-05-06 NOTE — Progress Notes (Signed)
Called to room to assist during endoscopic procedure.  Patient ID and intended procedure confirmed with present staff. Received instructions for my participation in the procedure from the performing physician.  

## 2014-05-07 ENCOUNTER — Telehealth: Payer: Self-pay | Admitting: *Deleted

## 2014-05-07 NOTE — Telephone Encounter (Signed)
  Follow up Call-  Call back number 05/06/2014  Post procedure Call Back phone  # [phone]  Permission to leave phone message Yes     Patient questions:  Do you have a fever, pain , or abdominal swelling? No. Pain Score  0 *  Have you tolerated food without any problems? No.  Have you been able to return to your normal activities? Yes.    Do you have any questions about your discharge instructions: Diet   No. Medications  No. Follow up visit  No.  Do you have questions or concerns about your Care? No.  Actions: * If pain score is 4 or above: No action needed, pain <4.

## 2014-05-14 ENCOUNTER — Encounter: Payer: Self-pay | Admitting: Internal Medicine

## 2014-05-14 ENCOUNTER — Telehealth: Payer: Self-pay | Admitting: Internal Medicine

## 2014-05-14 NOTE — Telephone Encounter (Signed)
Left a message for patient to call back. 

## 2014-05-20 NOTE — Telephone Encounter (Signed)
Spoke with pt and reviewed results letter with pt and she is aware.

## 2014-06-04 ENCOUNTER — Ambulatory Visit: Payer: BC Managed Care – PPO | Admitting: Family Medicine

## 2014-06-20 ENCOUNTER — Encounter: Payer: Self-pay | Admitting: Family Medicine

## 2014-06-20 ENCOUNTER — Encounter: Payer: Self-pay | Admitting: *Deleted

## 2014-07-04 ENCOUNTER — Telehealth: Payer: Self-pay | Admitting: *Deleted

## 2014-07-04 NOTE — Telephone Encounter (Signed)
Per email sent to Dr. Leward Quan, dated 06/20/2014, I am faxing request to [facility] report request.

## 2014-07-07 NOTE — Telephone Encounter (Signed)
Received faxed mammo report dated 05/19/2014 - oval mass in left breast with an obscured margin in upper outer aspect middle depth and is indeterminate 3D imaging as well as an ultrasound are recommended. Updated health maintenance, abstracted, routed to a physician and sent for scanning.Marland KitchenMarland Kitchen

## 2014-07-07 NOTE — Telephone Encounter (Signed)
Per [facility] report concerning mammogram finding was compared with a prior film- no change, lesion is c/w benign cyst

## 2014-10-15 ENCOUNTER — Encounter: Payer: Self-pay | Admitting: Family Medicine

## 2018-02-02 ENCOUNTER — Encounter (HOSPITAL_COMMUNITY): Payer: Self-pay | Admitting: Emergency Medicine

## 2018-02-02 ENCOUNTER — Observation Stay (HOSPITAL_COMMUNITY)
Admission: EM | Admit: 2018-02-02 | Discharge: 2018-02-03 | Disposition: A | Discharge status: Home / Self Care | Payer: BC Managed Care – PPO | Attending: Internal Medicine | Admitting: Internal Medicine

## 2018-02-02 ENCOUNTER — Other Ambulatory Visit: Payer: Self-pay

## 2018-02-02 ENCOUNTER — Emergency Department (HOSPITAL_COMMUNITY): Payer: BC Managed Care – PPO

## 2018-02-02 DIAGNOSIS — I160 Hypertensive urgency: Secondary | ICD-10-CM | POA: Insufficient documentation

## 2018-02-02 DIAGNOSIS — M47812 Spondylosis without myelopathy or radiculopathy, cervical region: Secondary | ICD-10-CM | POA: Diagnosis not present

## 2018-02-02 DIAGNOSIS — R42 Dizziness and giddiness: Secondary | ICD-10-CM | POA: Diagnosis present

## 2018-02-02 DIAGNOSIS — D649 Anemia, unspecified: Secondary | ICD-10-CM | POA: Diagnosis not present

## 2018-02-02 DIAGNOSIS — Z791 Long term (current) use of non-steroidal anti-inflammatories (NSAID): Secondary | ICD-10-CM | POA: Insufficient documentation

## 2018-02-02 DIAGNOSIS — Z79899 Other long term (current) drug therapy: Secondary | ICD-10-CM | POA: Insufficient documentation

## 2018-02-02 DIAGNOSIS — R0789 Other chest pain: Principal | ICD-10-CM | POA: Insufficient documentation

## 2018-02-02 DIAGNOSIS — R61 Generalized hyperhidrosis: Secondary | ICD-10-CM | POA: Diagnosis present

## 2018-02-02 DIAGNOSIS — R202 Paresthesia of skin: Secondary | ICD-10-CM | POA: Diagnosis not present

## 2018-02-02 DIAGNOSIS — R011 Cardiac murmur, unspecified: Secondary | ICD-10-CM | POA: Diagnosis not present

## 2018-02-02 DIAGNOSIS — E785 Hyperlipidemia, unspecified: Secondary | ICD-10-CM | POA: Diagnosis not present

## 2018-02-02 DIAGNOSIS — R079 Chest pain, unspecified: Secondary | ICD-10-CM | POA: Diagnosis not present

## 2018-02-02 DIAGNOSIS — Z87891 Personal history of nicotine dependence: Secondary | ICD-10-CM | POA: Diagnosis not present

## 2018-02-02 DIAGNOSIS — R030 Elevated blood-pressure reading, without diagnosis of hypertension: Secondary | ICD-10-CM | POA: Diagnosis present

## 2018-02-02 LAB — TROPONIN I
Troponin I: <0.03 ng/mL (ref <0.03)
Troponin I: <0.03 ng/mL (ref <0.03)

## 2018-02-02 LAB — BASIC METABOLIC PANEL
Anion gap: 10 (ref 5–15)
BUN: 10 mg/dL (ref 6–20)
CO2: 19 mmol/L — ABNORMAL LOW (ref 22–32)
Calcium: 10.2 mg/dL (ref 8.9–10.3)
Chloride: 111 mmol/L (ref 98–111)
Creatinine, Ser: 1.16 mg/dL — ABNORMAL HIGH (ref 0.44–1.00)
GFR calc Af Amer: >60 mL/min (ref >60)
GFR calc non Af Amer: 53 mL/min — ABNORMAL LOW (ref >60)
Glucose, Bld: 108 mg/dL — ABNORMAL HIGH (ref 70–99)
Potassium: 3.7 mmol/L (ref 3.5–5.1)
Sodium: 140 mmol/L (ref 135–145)

## 2018-02-02 LAB — I-STAT BETA HCG BLOOD, ED (MC, WL, AP ONLY): I-stat hCG, quantitative: <5 m[IU]/mL (ref <5)

## 2018-02-02 LAB — CBC
HCT: 44.5 % (ref 36.0–46.0)
Hemoglobin: 14.4 g/dL (ref 12.0–15.0)
MCH: 27.6 pg (ref 26.0–34.0)
MCHC: 32.4 g/dL (ref 30.0–36.0)
MCV: 85.2 fL (ref 80.0–100.0)
Platelets: 302 10*3/uL (ref 150–400)
RBC: 5.22 MIL/uL — ABNORMAL HIGH (ref 3.87–5.11)
RDW: 13.2 % (ref 11.5–15.5)
WBC: 6.4 10*3/uL (ref 4.0–10.5)
nRBC: 0 % (ref 0.0–0.2)

## 2018-02-02 LAB — I-STAT TROPONIN, ED: Troponin i, poc: 0.01 ng/mL (ref 0.00–0.08)

## 2018-02-02 MED ORDER — ONDANSETRON HCL 4 MG/2ML IJ SOLN: 4.0000 mg | Freq: Four times a day (QID) | INTRAMUSCULAR | Status: DC | PRN

## 2018-02-02 MED ORDER — ENOXAPARIN SODIUM 40 MG/0.4ML ~~LOC~~ SOLN
40.0000 mg | SUBCUTANEOUS | Status: DC
  Administered 2018-02-02: 40 mg via SUBCUTANEOUS
  Filled 2018-02-02: qty 0.4

## 2018-02-02 MED ORDER — ASPIRIN 81 MG PO CHEW
324.0000 mg | CHEWABLE_TABLET | Freq: Once | ORAL | Status: AC
  Administered 2018-02-02: 324 mg via ORAL
  Filled 2018-02-02: qty 4

## 2018-02-02 MED ORDER — ASPIRIN EC 325 MG PO TBEC
325.0000 mg | DELAYED_RELEASE_TABLET | Freq: Every day | ORAL | Status: DC
  Administered 2018-02-03: 325 mg via ORAL
  Filled 2018-02-02: qty 1

## 2018-02-02 MED ORDER — ALUM & MAG HYDROXIDE-SIMETH 200-200-20 MG/5ML PO SUSP
30.0000 mL | Freq: Once | ORAL | Status: AC
  Administered 2018-02-02: 30 mL via ORAL
  Filled 2018-02-02: qty 30

## 2018-02-02 MED ORDER — LIDOCAINE VISCOUS HCL 2 % MT SOLN
15.0000 mL | Freq: Once | OROMUCOSAL | Status: AC
  Administered 2018-02-02: 15 mL via ORAL
  Filled 2018-02-02: qty 15

## 2018-02-02 MED ORDER — ACETAMINOPHEN 325 MG PO TABS
650.0000 mg | ORAL_TABLET | ORAL | Status: DC | PRN
  Administered 2018-02-02: 650 mg via ORAL
  Filled 2018-02-02 (×2): qty 2

## 2018-02-02 MED ORDER — HYDRALAZINE HCL 20 MG/ML IJ SOLN
5.0000 mg | Freq: Once | INTRAMUSCULAR | Status: AC
  Administered 2018-02-02: 5 mg via INTRAVENOUS
  Filled 2018-02-02: qty 1

## 2018-02-02 MED ORDER — MORPHINE SULFATE (PF) 2 MG/ML IV SOLN: 2.0000 mg | INTRAVENOUS | Status: DC | PRN

## 2018-02-02 NOTE — ED Provider Notes (Signed)
[facility] EMERGENCY DEPARTMENT Provider Note   CSN: [id number] Arrival date & time: 02/02/18  1224     History   Chief Complaint Chief Complaint  Patient presents with  . Chest Pain    HPI Marissa Rasmussen is a 58 y.o. female with a PMHx of anemia, HLD, heart murmur, and other conditions listed below, who presents to the ED with complaints of 2 to 3 weeks of left upper arm heaviness and left arm tingling, with an episode today of brief chest pain, lightheadedness, and diaphoresis.  Patient states that over the last 2 to 3 weeks she has had intermittent left upper arm tingling and heaviness mostly at night, she thought it may be due to how she was sleeping, however today when she was sitting at her desk she started having the heaviness and tingling again, but this time it was accompanied by diaphoresis, lightheadedness, and central chest pain.  She describes the chest pain is 4/10 constant sharp nonradiating central chest pain with no known aggravating factor, lasting only a few seconds, and resolved entirely after belching.  She has no ongoing chest pain at this time.  She states that she has some mild left sided neck soreness which she also attributed to sleeping incorrectly.  She has a cardiologist at the [facility] in [place], her PCP is Dr. Vista Deck in [place].  She is a non-smoker, her father had an MI at 10 years old.  She is never had any issues with her blood pressure in the past, one time she had high blood pressure after an ED visit but they did not give her any medications, and her blood pressures been monitored at her PCPs office and has never been elevated, she has never been on blood pressure medications.  She denies any fevers, cough, shortness of breath, leg swelling, recent travel/surgery/immobilization, estrogen use, personal or family history of DVT/PE, abdominal pain, nausea, vomiting, diarrhea, constipation, dysuria, hematuria, numbness, focal  weakness, claudication, orthopnea, headache, vision changes, or any other complaints at this time.  The history is provided by the patient and medical records. No language interpreter was used.  Chest Pain  Associated symptoms: diaphoresis   Associated symptoms: no abdominal pain, no cough, no fever, no headache, no nausea, no numbness, no shortness of breath, no vomiting and no weakness     Past Medical History:  Diagnosis Date  . Allergy   . Anemia   . Arthritis    knees  . Blood transfusion without reported diagnosis   . Heart murmur   . Hemorrhoids   . Hyperlipidemia     Patient Active Problem List   Diagnosis Date Noted  . Third degree hemorrhoids 03/05/2014  . Fibrocystic breast changes of both breasts 03/05/2014  . Knee pain, bilateral 03/05/2014  . Obesity 03/05/2014  . Allergic rhinitis 03/05/2014    Past Surgical History:  Procedure Laterality Date  . CESAREAN SECTION     per patient's [place] - 2 cesarean sect  . HERNIA REPAIR    . POLYPECTOMY     attached to stomach wall  . TONSILLECTOMY       OB History   No obstetric history on file.      Home Medications    Prior to Admission medications   Medication Sig Start Date End Date Taking? Authorizing Provider  B Complex Vitamins (VITAMIN B COMPLEX PO) Take by mouth daily.    [provider]  ibuprofen (ADVIL) 200 MG tablet Take  2 tablets by mouth every 6-8 hours prn pain. 03/05/14   Barton Fanny, MD  loratadine (CLARITIN) 10 MG tablet Take 1 tablet (10 mg total) by mouth daily. 03/05/14   Barton Fanny, MD  metroNIDAZOLE (FLAGYL) 500 MG tablet Take 1 tablet (500 mg total) by mouth 2 (two) times daily. 04/28/14   Evelina Dun A, FNP  Triamcinolone Acetonide (NASACORT AQ NA) Place 50 mcg into the nose. Nasal mist spray    [provider]    Family History Family History  Problem Relation Age of Onset  . Cancer Mother        Bowel duct  . Hypertension  Mother   . Stroke Mother   . Hyperlipidemia Mother   . Hypertension Father   . Diabetes Father   . Heart disease Father   . Hyperlipidemia Father   . Heart disease Sister   . Hypertension Sister   . Cancer Maternal Grandmother   . Heart disease Paternal Grandmother   . Cancer Paternal Grandfather   . Hypertension Sister   . Colon cancer Neg Hx   . Esophageal cancer Neg Hx   . Stomach cancer Neg Hx   . Rectal cancer Neg Hx     Social History Social History   Tobacco Use  . Smoking status: Former Research scientist (life sciences)  . Smokeless tobacco: Never Used  Substance Use Topics  . Alcohol use: Yes    Alcohol/week: 1.0 - 2.0 standard drinks    Types: 1 - 2 Standard drinks or equivalent per week    Comment: 1 5 oz glass of red wine occasionally  . Drug use: No     Allergies   Patient has no known allergies.   Review of Systems Review of Systems  Constitutional: Positive for diaphoresis. Negative for chills and fever.  Eyes: Negative for visual disturbance.  Respiratory: Negative for cough and shortness of breath.   Cardiovascular: Positive for chest pain. Negative for leg swelling.  Gastrointestinal: Negative for abdominal pain, constipation, diarrhea, nausea and vomiting.  Genitourinary: Negative for dysuria and hematuria.  Musculoskeletal: Positive for myalgias. Negative for arthralgias.  Skin: Negative for color change.  Allergic/Immunologic: Negative for immunocompromised state.  Neurological: Positive for light-headedness. Negative for weakness, numbness and headaches.  Psychiatric/Behavioral: Negative for confusion.   All other systems reviewed and are negative for acute change except as noted in the HPI.    Physical Exam Updated Vital Signs BP (!) 181/91 (BP Location: Right Arm)   Pulse 89   Temp 97.9 F (36.6 C) (Oral)   Resp 18   SpO2 100%   Physical Exam Vitals signs and nursing note reviewed.  Constitutional:      General: She is not in acute distress.     Appearance: Normal appearance. She is well-developed. She is not toxic-appearing.     Comments: Afebrile, nontoxic, NAD, BP 180s/90s  HENT:     Head: Normocephalic and atraumatic.  Eyes:     General:        Right eye: No discharge.        Left eye: No discharge.     Conjunctiva/sclera: Conjunctivae normal.  Neck:     Musculoskeletal: Normal range of motion and neck supple. Normal range of motion. Muscular tenderness present. No neck rigidity or spinous process tenderness.      Comments: FROM intact without spinous process TTP, no bony stepoffs or deformities, with mild L sided paraspinous muscle TTP and muscle spasms. No rigidity or meningeal signs. No bruising  or swelling.  Cardiovascular:     Rate and Rhythm: Normal rate and regular rhythm.     Pulses: Normal pulses.     Heart sounds: Normal heart sounds, S1 normal and S2 normal. No murmur. No friction rub. No gallop.      Comments: RRR, nl s1/s2, no m/r/g, distal pulses intact, no pedal edema  Pulmonary:     Effort: Pulmonary effort is normal. No respiratory distress.     Breath sounds: Normal breath sounds. No decreased breath sounds, wheezing, rhonchi or rales.     Comments: CTAB in all lung fields, no w/r/r, no hypoxia or increased WOB, speaking in full sentences, SpO2 100% on RA  Chest:     Chest wall: No deformity, tenderness or crepitus.     Comments: Chest wall nonTTP without crepitus, deformities, or retractions  Abdominal:     General: Bowel sounds are normal. There is no distension.     Palpations: Abdomen is soft. Abdomen is not rigid.     Tenderness: There is no abdominal tenderness. There is no right CVA tenderness, left CVA tenderness, guarding or rebound. Negative signs include Murphy's sign and McBurney's sign.  Musculoskeletal: Normal range of motion.     Comments: No focal area of TTP to LUE, FROM intact in all major joints, strength and sensation grossly intact, distal pulses intact, soft compartments.  No pedal  edema, neg homan's bilaterally  Skin:    General: Skin is warm and dry.     Findings: No rash.  Neurological:     Mental Status: She is alert and oriented to person, place, and time.     Sensory: Sensation is intact. No sensory deficit.     Motor: Motor function is intact.  Psychiatric:        Mood and Affect: Mood and affect normal.        Behavior: Behavior normal.      ED Treatments / Results  Labs (all labs ordered are listed, but only abnormal results are displayed) Labs Reviewed  BASIC METABOLIC PANEL - Abnormal; Notable for the following components:      Result Value   CO2 19 (*)    Glucose, Bld 108 (*)    Creatinine, Ser 1.16 (*)    GFR calc non Af Amer 53 (*)    All other components within normal limits  CBC - Abnormal; Notable for the following components:   RBC 5.22 (*)    All other components within normal limits  I-STAT TROPONIN, ED  I-STAT BETA HCG BLOOD, ED (MC, [facility], AP ONLY)    EKG EKG Interpretation  Date/Time:  Friday February 02 2018 12:34:24 EST Ventricular Rate:  76 PR Interval:  178 QRS Duration: 90 QT Interval:  400 QTC Calculation: 450 R Axis:   94 Text Interpretation:  Normal sinus rhythm with sinus arrhythmia Rightward axis Borderline ECG No old tracing to compare Confirmed by Virgel Manifold [id number]) on 02/02/2018 2:06:50 PM   Radiology Dg Chest 2 View  Result Date: 02/02/2018 CLINICAL DATA:  Chest pain EXAM: CHEST - 2 VIEW COMPARISON:  None. FINDINGS: The heart size and mediastinal contours are within normal limits. Both lungs are clear. The visualized skeletal structures are unremarkable. IMPRESSION: No active cardiopulmonary disease. Electronically Signed   By: Franchot Gallo M.D.   On: 02/02/2018 14:28   Dg Cervical Spine Complete  Result Date: 02/02/2018 CLINICAL DATA:  Fall 01/05/2018.  Left neck pain and left arm pain EXAM: CERVICAL SPINE - COMPLETE 4+ VIEW  COMPARISON:  None. FINDINGS: Mild anterolisthesis C4-5. Disc degeneration and  spurring is mild at C4-5, C5-6, C6-7. Mild foraminal narrowing on the right at C5-6 and C6-7 and on the left at C5-6. Negative for fracture. IMPRESSION: Cervical degenerative change.  Negative for fracture Electronically Signed   By: Franchot Gallo M.D.   On: 02/02/2018 14:30    Procedures Procedures (including critical care time)  Medications Ordered in ED Medications  aspirin chewable tablet 324 mg (324 mg Oral Given 02/02/18 1340)  hydrALAZINE (APRESOLINE) injection 5 mg (5 mg Intravenous Given 02/02/18 1340)     Initial Impression / Assessment and Plan / ED Course  I have reviewed the triage vital signs and the nursing notes.  Pertinent labs & imaging results that were available during my care of the patient were reviewed by me and considered in my medical decision making (see chart for details).     58 y.o. female here with 2-3wks of L arm heaviness and tingling, and then an episode of central CP with diaphoresis and lightheadedness today that completely resolved after belching. On exam, mild L trapezius spasm and soreness to palpation, no spinal tenderness, BP 180s/90s which is new for her, no chest wall TTP, no pedal edema, no tachycardia or hypoxia, clear lung exam, no abdominal tenderness. No tenderness to the LUE. Strength/sensation intact, NVI. Work up thus far reveals: betaHCG neg, trop neg, BMP with Cr 1.16 and bicarb 19 but otherwise WNL, CBC WNL. EKG nonischemic. Awaiting CXR, will also get C-spine xray. Will give ASA and hydralazine and reassess shortly.   2:54 PM CXR negative. C-spine xray showing mild disc degeneration at multiple levels and mild foraminal narrowing at R C6-7 and L C5-6. My concern is that the left arm heaviness is a chest pain equivalent, and given her diaphoresis and lightheadedness episode this morning, would need ACS r/o and control of her HTN urgency. Discussed this with pt, and she would like to talk with her family before deciding on admission. Will  order 3-hr troponin for now and await her response. Discussed case with my attending Dr. Wilson Singer who agrees with plan.   3:04 PM Pt decided that she is willing to stay for ACS r/o and HTN urgency management. Will proceed with admission.   3:33 PM Dr. Evangeline Gula of [facility] returning page and will admit. Holding orders to be placed by admitting team. Please see their notes for further documentation of care. I appreciate their help with this pleasant pt's care. Pt stable at time of admission.    Final Clinical Impressions(s) / ED Diagnoses   Final diagnoses:  Arm paresthesia, left  Atypical chest pain  Hypertensive urgency    ED Discharge Orders    [address] [place], [place], [place] 02/02/18 1533    Virgel Manifold, MD 02/03/18 [phone]

## 2018-02-02 NOTE — H&P (Signed)
History and Physical    Marissa Rasmussen OHY:[id number] DOB: 1960/02/08 DOA: 02/02/2018  PCP: System, Pcp Not In  Patient coming from: Home  I have personally briefly reviewed patient's old medical records in [place]  Chief Complaint: Chest pain  HPI: Marissa Rasmussen is a 58 y.o. female with medical history significant of her cystic breasts, obesity, allergic rhinitis who presents to the emergency department with complaints of chest pains.  There is some note in the chart that she has hyperlipidemia, anemia and a heart murmur but none of these are listed in her medical history.  She is on no medications for hyperlipidemia or cardiac meds.  Patient states that over the past 2 to 3 weeks she has had some left upper arm heaviness and left arm tingling she had an episode of brief chest pain today.  It was associated with lightheadedness and diaphoresis.  She said that over the past 2 to 3 weeks she has been having intermittent left upper arm tingling and heaviness at night she thought it might be due to how she was sleeping but today she was at her desk is having the heaviness again accompanied by the diaphoresis and the chest pain.  She states that the chest pain got better after burping.  She had 4 out of 10 chest pain which was constant sharp and nonradiating it only lasted a few seconds and relieved resolved entirely after belching.  She has had no further chest pain since then.  Her cardiologist is at the [facility] in [place].  Her PCP is Dr. Morene Antu in [place].  She does not smoke.  Her father had an MI at 55 years old.  She has never had any issues with her blood pressure in the past.  Today however on presentation to the ER her blood pressure was elevated.  She checks her blood pressure at home and is usually in the 120s over 60 range.  In late December she had a fall where she injured her neck x-rays were obtained in the emergency department which showed some C-spine  degenerative changes but no fractures. He denies any fevers, cough, shortness of breath, leg swelling, recent travel/surgery/immobilization.  She does not use any estrogen she has no personal or family history of DVT or PE, she has no abdominal pain, nausea, vomiting, diarrhea, constipation, dysuria, hematuria, numbness, focal weakness, claudication, orthopnea, headache, vision changes or any other complaints at this time. ED Course: KG unremarkable, chest pain has not recurred, blood pressure elevated which resolved the patient was settled in the emergency department.  Review of Systems: As per HPI otherwise all other systems reviewed and  negative.   Past Medical History:  Diagnosis Date  . Allergy   . Anemia   . Arthritis    knees  . Blood transfusion without reported diagnosis   . Heart murmur   . Hemorrhoids   . Hyperlipidemia     Past Surgical History:  Procedure Laterality Date  . CESAREAN SECTION     per patient's [place] - 2 cesarean sect  . HERNIA REPAIR    . POLYPECTOMY     attached to stomach wall  . TONSILLECTOMY      Social History   Social History Narrative  . Not on file     reports that she has quit smoking. She has never used smokeless tobacco. She reports current alcohol use of about 1.0 - 2.0 standard drinks of alcohol per week. She reports that she  does not use drugs.  Allergies  Allergen Reactions  . Cyclobenzaprine Other (See Comments)    Made the patient lethargic    Family History  Problem Relation Age of Onset  . Cancer Mother        Bowel duct  . Hypertension Mother   . Stroke Mother   . Hyperlipidemia Mother   . Hypertension Father   . Diabetes Father   . Heart disease Father   . Hyperlipidemia Father   . Heart disease Sister   . Hypertension Sister   . Cancer Maternal Grandmother   . Heart disease Paternal Grandmother   . Cancer Paternal Grandfather   . Hypertension Sister   . Colon cancer Neg Hx   . Esophageal  cancer Neg Hx   . Stomach cancer Neg Hx   . Rectal cancer Neg Hx      Prior to Admission medications   Medication Sig Start Date End Date Taking? Authorizing Provider  B Complex Vitamins (VITAMIN B COMPLEX PO) Take by mouth daily.    [provider]  ibuprofen (ADVIL) 200 MG tablet Take 2 tablets by mouth every 6-8 hours prn pain. 03/05/14   Barton Fanny, MD  loratadine (CLARITIN) 10 MG tablet Take 1 tablet (10 mg total) by mouth daily. 03/05/14   Barton Fanny, MD  metroNIDAZOLE (FLAGYL) 500 MG tablet Take 1 tablet (500 mg total) by mouth 2 (two) times daily. 04/28/14   Evelina Dun A, FNP  Triamcinolone Acetonide (NASACORT AQ NA) Place 50 mcg into the nose. Nasal mist spray    [provider]    Physical Exam:  Constitutional: NAD, calm, comfortable Vitals:   02/02/18 1229 02/02/18 1340 02/02/18 1343  BP: (!) 181/91 (!) 158/72 (!) 158/72  Pulse: 89  70  Resp: 18  18  Temp: 97.9 F (36.6 C)    TempSrc: Oral    SpO2: 100%  100%   Eyes: PERRL, lids and conjunctivae normal ENMT: Mucous membranes are moist. Posterior pharynx clear of any exudate or lesions.Normal dentition.  Neck: normal, supple, no masses, no thyromegaly Respiratory: clear to auscultation bilaterally, no wheezing, no crackles. Normal respiratory effort. No accessory muscle use.  Cardiovascular: Regular rate and rhythm, no murmurs / rubs / gallops. No extremity edema. 2+ pedal pulses. No carotid bruits.  Abdomen: no tenderness, no masses palpated. No hepatosplenomegaly. Bowel sounds positive.  Musculoskeletal: no clubbing / cyanosis. No joint deformity upper and lower extremities. Good ROM, no contractures. Normal muscle tone.  Skin: no rashes, lesions, ulcers. No induration Neurologic: CN 2-12 grossly intact. Sensation intact, DTR normal. Strength 5/5 in all 4.  Psychiatric: Normal judgment and insight. Alert and oriented x 3. Normal mood.    Labs on Admission: I have personally  reviewed following labs and imaging studies  CBC: Recent Labs  Lab 02/02/18 1244  WBC 6.4  HGB 14.4  HCT 44.5  MCV 85.2  PLT 194   Basic Metabolic Panel: Recent Labs  Lab 02/02/18 1244  NA 140  K 3.7  CL 111  CO2 19*  GLUCOSE 108*  BUN 10  CREATININE 1.16*  CALCIUM 10.2   GFR: CrCl cannot be calculated (Unknown ideal weight.). Liver Function Tests: No results for input(s): AST, ALT, ALKPHOS, BILITOT, PROT, ALBUMIN in the last 168 hours. No results for input(s): LIPASE, AMYLASE in the last 168 hours. No results for input(s): AMMONIA in the last 168 hours. Coagulation Profile: No results for input(s): INR, PROTIME in the last 168 hours. Cardiac  Enzymes: No results for input(s): CKTOTAL, CKMB, CKMBINDEX, TROPONINI in the last 168 hours. BNP (last 3 results) No results for input(s): PROBNP in the last 8760 hours. HbA1C: No results for input(s): HGBA1C in the last 72 hours. CBG: No results for input(s): GLUCAP in the last 168 hours. Lipid Profile: No results for input(s): CHOL, HDL, LDLCALC, TRIG, CHOLHDL, LDLDIRECT in the last 72 hours. Thyroid Function Tests: No results for input(s): TSH, T4TOTAL, FREET4, T3FREE, THYROIDAB in the last 72 hours. Anemia Panel: No results for input(s): VITAMINB12, FOLATE, FERRITIN, TIBC, IRON, RETICCTPCT in the last 72 hours. Urine analysis:    Component Value Date/Time   BILIRUBINUR neg 03/05/2014 1146   PROTEINUR neg 03/05/2014 1146   UROBILINOGEN 0.2 03/05/2014 1146   NITRITE neg 03/05/2014 1146   LEUKOCYTESUR Negative 03/05/2014 1146    Radiological Exams on Admission: Dg Chest 2 View  Result Date: 02/02/2018 CLINICAL DATA:  Chest pain EXAM: CHEST - 2 VIEW COMPARISON:  None. FINDINGS: The heart size and mediastinal contours are within normal limits. Both lungs are clear. The visualized skeletal structures are unremarkable. IMPRESSION: No active cardiopulmonary disease. Electronically Signed   By: Franchot Gallo M.D.   On:  02/02/2018 14:28   Dg Cervical Spine Complete  Result Date: 02/02/2018 CLINICAL DATA:  Fall 01/05/2018.  Left neck pain and left arm pain EXAM: CERVICAL SPINE - COMPLETE 4+ VIEW COMPARISON:  None. FINDINGS: Mild anterolisthesis C4-5. Disc degeneration and spurring is mild at C4-5, C5-6, C6-7. Mild foraminal narrowing on the right at C5-6 and C6-7 and on the left at C5-6. Negative for fracture. IMPRESSION: Cervical degenerative change.  Negative for fracture Electronically Signed   By: Franchot Gallo M.D.   On: 02/02/2018 14:30    EKG: Independently reviewed.  Sinus rhythm with sinus arrhythmia and a rightward axis but no prior EKGs are available for comparison.  Assessment/Plan Principal Problem:   Chest pain Active Problems:   Elevated blood pressure reading without diagnosis of hypertension   1.  Chest pain: The patient in observation.  We will check serial enzymes and repeat EKG in the morning.  I have ordered a nuclear medicine stress test.  If all of this is acceptable she can discharge home.  If stress test is abnormal she may require cardiology evaluation.  In 2017 she had an echocardiogram at [facility] in [place] for PVCs but the result is not available.  She did have a Holter monitor which showed some PVCs but otherwise has had no cardiac issues.  2.  Elevated blood-pressure reading without a diagnosis of hypertension: Patient has had several elevated blood pressure readings at doctor's offices but at home they are always normal.  Her primary care physician is aware.  I recommend she check her blood pressure again at home.  It did settle out when she was here for little bit.  3.  Cervical spine degenerative changes: It may have some brachial plexus issues due to injury on December 27.  Would recommend she follow-up with her primary care physician if stress testing is unremarkable.  This may be the source of her chest discomfort.    DVT prophylaxis: Lovenox Code Status: Full  code Family Communication: Patient's daughter was present who is a minor Disposition Plan: Likely home in tomorrow afternoon Consults called: None Admission status: Observation   Lady Deutscher MD [facility] Pager [phone]  If 7PM-7AM, please contact night-coverage [email or web address] Password [facility]  02/02/2018, 4:17 PM

## 2018-02-02 NOTE — ED Notes (Signed)
ED TO INPATIENT HANDOFF REPORT  Name/Age/Gender Marissa Rasmussen 58 y.o. female  Code Status    Code Status Orders  (From admission, onward)         Start     Ordered   02/02/18 1553  Full code  Continuous     02/02/18 1556        Code Status History    This patient has a current code status but no historical code status.      Home/SNF/Other Home  Chief Complaint Tingling/heaviness in arm  Level of Care/Admitting Diagnosis ED Disposition    ED Disposition Condition [facility] Area: [place] [[id number]]  Level of Care: Medical Telemetry [104]  I expect the patient will be discharged within 24 hours: Yes  LOW acuity---Tx typically complete <24 hrs---ACUTE conditions typically can be evaluated <24 hours---LABS likely to return to acceptable levels <24 hours---IS near functional baseline---EXPECTED to return to current living arrangement---NOT newly hypoxic: Meets criteria for 5C-Observation unit  Diagnosis: Chest pain [275170]  Admitting Physician: Lady Deutscher [[id number]]  Attending Physician: Lady Deutscher [[id number]]  PT Class (Do Not Modify): Observation [104]  PT Acc Code (Do Not Modify): Observation [10022]       Medical History Past Medical History:  Diagnosis Date  . Allergy   . Anemia   . Arthritis    knees  . Blood transfusion without reported diagnosis   . Heart murmur   . Hemorrhoids   . Hyperlipidemia     Allergies Allergies  Allergen Reactions  . Cyclobenzaprine Other (See Comments)    Made the patient lethargic    IV Location/Drains/Wounds Patient Lines/Drains/Airways Status   Active Line/Drains/Airways    Name:   Placement date:   Placement time:   Site:   Days:   Peripheral IV 02/02/18 Left Antecubital   02/02/18    1344    Antecubital   less than 1          Labs/Imaging Results for orders placed or performed during the hospital encounter of 02/02/18 (from the past 48 hour(s))  Basic  metabolic panel     Status: Abnormal   Collection Time: 02/02/18 12:44 PM  Result Value Ref Range   Sodium 140 135 - 145 mmol/L   Potassium 3.7 3.5 - 5.1 mmol/L   Chloride 111 98 - 111 mmol/L   CO2 19 (L) 22 - 32 mmol/L   Glucose, Bld 108 (H) 70 - 99 mg/dL   BUN 10 6 - 20 mg/dL   Creatinine, Ser 1.16 (H) 0.44 - 1.00 mg/dL   Calcium 10.2 8.9 - 10.3 mg/dL   GFR calc non Af Amer 53 (L) >60 mL/min   GFR calc Af Amer >60 >60 mL/min   Anion gap 10 5 - 15    Comment: Performed at [facility], [place] [address], [place], [place] [address]  CBC     Status: Abnormal   Collection Time: 02/02/18 12:44 PM  Result Value Ref Range   WBC 6.4 4.0 - 10.5 K/uL   RBC 5.22 (H) 3.87 - 5.11 MIL/uL   Hemoglobin 14.4 12.0 - 15.0 g/dL   HCT 44.5 36.0 - 46.0 %   MCV 85.2 80.0 - 100.0 fL   MCH 27.6 26.0 - 34.0 pg   MCHC 32.4 30.0 - 36.0 g/dL   RDW 13.2 11.5 - 15.5 %   Platelets 302 150 - 400 K/uL   nRBC 0.0 0.0 - 0.2 %  Comment: Performed at [facility], [place] [address] [place], [place] [address]  I-stat troponin, ED     Status: None   Collection Time: 02/02/18 12:57 PM  Result Value Ref Range   Troponin i, poc 0.01 0.00 - 0.08 ng/mL   Comment 3            Comment: Due to the release kinetics of cTnI, a negative result within the first hours of the onset of symptoms does not rule out myocardial infarction with certainty. If myocardial infarction is still suspected, repeat the test at appropriate intervals.   I-Stat beta hCG blood, ED     Status: None   Collection Time: 02/02/18 12:57 PM  Result Value Ref Range   I-stat hCG, quantitative <5.0 <5 mIU/mL   Comment 3            Comment:   GEST. AGE      CONC.  (mIU/mL)   <=1 WEEK        5 - 50     2 WEEKS       50 - 500     3 WEEKS       100 - 10,000     4 WEEKS     1,000 - 30,000        FEMALE AND NON-PREGNANT FEMALE:     LESS THAN 5 mIU/mL    Dg Chest 2 View  Result Date: 02/02/2018 CLINICAL DATA:  Chest pain EXAM: CHEST - 2  VIEW COMPARISON:  None. FINDINGS: The heart size and mediastinal contours are within normal limits. Both lungs are clear. The visualized skeletal structures are unremarkable. IMPRESSION: No active cardiopulmonary disease. Electronically Signed   By: Franchot Gallo M.D.   On: 02/02/2018 14:28   Dg Cervical Spine Complete  Result Date: 02/02/2018 CLINICAL DATA:  Fall 01/05/2018.  Left neck pain and left arm pain EXAM: CERVICAL SPINE - COMPLETE 4+ VIEW COMPARISON:  None. FINDINGS: Mild anterolisthesis C4-5. Disc degeneration and spurring is mild at C4-5, C5-6, C6-7. Mild foraminal narrowing on the right at C5-6 and C6-7 and on the left at C5-6. Negative for fracture. IMPRESSION: Cervical degenerative change.  Negative for fracture Electronically Signed   By: Franchot Gallo M.D.   On: 02/02/2018 14:30    Pending Labs Unresulted Labs (From admission, onward)    Start     Ordered   02/09/18 0500  Creatinine, serum  (enoxaparin (LOVENOX)    CrCl >/= 30 ml/min)  Weekly,   R    Comments:  while on enoxaparin therapy    02/02/18 1556   02/02/18 1554  CBC  (enoxaparin (LOVENOX)    CrCl >/= 30 ml/min)  Once,   R    Comments:  Baseline for enoxaparin therapy IF NOT ALREADY DRAWN.  Notify MD if PLT < 100 K.    02/02/18 1556   02/02/18 1554  Creatinine, serum  (enoxaparin (LOVENOX)    CrCl >/= 30 ml/min)  Once,   R    Comments:  Baseline for enoxaparin therapy IF NOT ALREADY DRAWN.    02/02/18 1556   02/02/18 1553  Troponin I - Now Then Q3H  Now then every 3 hours,   TIMED     02/02/18 1556   02/02/18 1552  HIV antibody (Routine Testing)  Once,   R     02/02/18 1556          Vitals/Pain Today's Vitals   02/02/18 1229 02/02/18 1242 02/02/18 1340 02/02/18  1343  BP: (!) 181/91  (!) 158/72 (!) 158/72  Pulse: 89   70  Resp: 18   18  Temp: 97.9 F (36.6 C)     TempSrc: Oral     SpO2: 100%   100%  PainSc:  0-No pain      Isolation Precautions No active isolations  Medications Medications   acetaminophen (TYLENOL) tablet 650 mg (has no administration in time range)  ondansetron (ZOFRAN) injection 4 mg (has no administration in time range)  enoxaparin (LOVENOX) injection 40 mg (has no administration in time range)  morphine 2 MG/ML injection 2 mg (has no administration in time range)  alum & mag hydroxide-simeth (MAALOX/MYLANTA) 200-200-20 MG/5ML suspension 30 mL (has no administration in time range)    And  lidocaine (XYLOCAINE) 2 % viscous mouth solution 15 mL (has no administration in time range)  aspirin EC tablet 325 mg (has no administration in time range)  aspirin chewable tablet 324 mg (324 mg Oral Given 02/02/18 1340)  hydrALAZINE (APRESOLINE) injection 5 mg (5 mg Intravenous Given 02/02/18 1340)    Mobility walks

## 2018-02-02 NOTE — ED Triage Notes (Signed)
Pt to ER for evaluation of 2 weeks left arm heaviness and tingling intermittently. Today she felt a sharp chest pain, associated with diaphoresis and near syncope while sitting at her desk at work. Denies medical problems. Reports history of smoking, quit 8 years ago. +Family hx.

## 2018-02-03 ENCOUNTER — Observation Stay (HOSPITAL_BASED_OUTPATIENT_CLINIC_OR_DEPARTMENT_OTHER): Payer: BC Managed Care – PPO

## 2018-02-03 DIAGNOSIS — I249 Acute ischemic heart disease, unspecified: Secondary | ICD-10-CM

## 2018-02-03 DIAGNOSIS — R030 Elevated blood-pressure reading, without diagnosis of hypertension: Secondary | ICD-10-CM

## 2018-02-03 DIAGNOSIS — Z87891 Personal history of nicotine dependence: Secondary | ICD-10-CM | POA: Diagnosis not present

## 2018-02-03 DIAGNOSIS — R0789 Other chest pain: Secondary | ICD-10-CM

## 2018-02-03 DIAGNOSIS — R202 Paresthesia of skin: Secondary | ICD-10-CM | POA: Diagnosis not present

## 2018-02-03 DIAGNOSIS — I160 Hypertensive urgency: Secondary | ICD-10-CM | POA: Diagnosis not present

## 2018-02-03 LAB — NM MYOCAR MULTI W/SPECT W/WALL MOTION / EF
Estimated workload: 1 METS
Exercise duration (min): 0 min
Exercise duration (sec): 0 s
MPHR: 164 {beats}/min
Peak HR: 99 {beats}/min
Percent HR: 60 %
Rest HR: 59 {beats}/min

## 2018-02-03 LAB — HIV ANTIBODY (ROUTINE TESTING W REFLEX): HIV Screen 4th Generation wRfx: NONREACTIVE

## 2018-02-03 LAB — TROPONIN I: Troponin I: <0.03 ng/mL (ref <0.03)

## 2018-02-03 MED ORDER — METHOCARBAMOL 500 MG PO TABS: 500.0000 mg | ORAL_TABLET | Freq: Four times a day (QID) | ORAL | 0 refills | Status: AC | PRN

## 2018-02-03 MED ORDER — METHOCARBAMOL 500 MG PO TABS
500.0000 mg | ORAL_TABLET | Freq: Four times a day (QID) | ORAL | Status: DC | PRN
  Administered 2018-02-03: 500 mg via ORAL
  Filled 2018-02-03: qty 1

## 2018-02-03 MED ORDER — REGADENOSON 0.4 MG/5ML IV SOLN
0.4000 mg | Freq: Once | INTRAVENOUS | Status: AC
  Administered 2018-02-03: 0.4 mg via INTRAVENOUS
  Filled 2018-02-03: qty 5

## 2018-02-03 MED ORDER — TECHNETIUM TC 99M TETROFOSMIN IV KIT
10.0000 | PACK | Freq: Once | INTRAVENOUS | Status: AC | PRN
  Administered 2018-02-03: 10 via INTRAVENOUS

## 2018-02-03 MED ORDER — TECHNETIUM TC 99M TETROFOSMIN IV KIT
30.0000 | PACK | Freq: Once | INTRAVENOUS | Status: AC | PRN
  Administered 2018-02-03: 30 via INTRAVENOUS

## 2018-02-03 MED ORDER — REGADENOSON 0.4 MG/5ML IV SOLN
INTRAVENOUS | Status: AC
  Filled 2018-02-03: qty 5

## 2018-02-03 NOTE — Progress Notes (Addendum)
   Ardelle Lesches presented for a 1-day Lexiscan cardiolite today.  No immediate complications.  Stress imaging is pending at this time.  Erma Heritage, PA-C 02/03/2018, 10:38 AM   Stress Test showed:    Nonspecific T wave abnormalities seen throughout study with late R wave transition.  Global reduction in radiotracer uptake during stress which appears due to variable soft tissue attenuation (particularly in inferior, inferoapical, and anteroapical walls; likely breast attenuation). Regional wall motion is normal, indicative of defects being due to artifact.  Nuclear stress EF: 63%.  This is a low risk study  Admitting team made aware as stress test was ordered by the Hospitalist.   Signed, Erma Heritage, PA-C 02/03/2018, 1:03 PM Pager: [phone]

## 2018-02-03 NOTE — Discharge Summary (Signed)
Physician Discharge Summary  Marissa Rasmussen UYQ:[id number] DOB: [birth year] DOA: 02/02/2018  PCP: System, Pcp Not In PCP is with [facility] in [place]  Admit date: 02/02/2018 Discharge date: 02/03/2018  Admitted From: home Discharge disposition: home   Recommendations for Outpatient Follow-Up:   1. ?referral to NS vs PT for neck pain-- trial of muscle relaxers 2. Patient to bring log of BP to PCP   Discharge Diagnosis:   Principal Problem:   Chest pain Active Problems:   Elevated blood pressure reading without diagnosis of hypertension    Discharge Condition: Improved.  Diet recommendation: Low sodium, heart healthy  Wound care: None.  Code status: Full.   History of Present Illness:   Marissa Rasmussen is a 58 y.o. female with medical history significant of her cystic breasts, obesity, allergic rhinitis who presents to the emergency department with complaints of chest pains.  There is some note in the chart that she has hyperlipidemia, anemia and a heart murmur but none of these are listed in her medical history.  She is on no medications for hyperlipidemia or cardiac meds.  Patient states that over the past 2 to 3 weeks she has had some left upper arm heaviness and left arm tingling she had an episode of brief chest pain today.  It was associated with lightheadedness and diaphoresis.  She said that over the past 2 to 3 weeks she has been having intermittent left upper arm tingling and heaviness at night she thought it might be due to how she was sleeping but today she was at her desk is having the heaviness again accompanied by the diaphoresis and the chest pain.  She states that the chest pain got better after burping.  She had 4 out of 10 chest pain which was constant sharp and nonradiating it only lasted a few seconds and relieved resolved entirely after belching.  She has had no further chest pain since then.  Her cardiologist is at the [facility] in  [place].  Her PCP is Dr. Morene Antu in [place].  She does not smoke.  Her father had an MI at 42 years old.  She has never had any issues with her blood pressure in the past.  Today however on presentation to the ER her blood pressure was elevated.  She checks her blood pressure at home and is usually in the 120s over 60 range.  In late December she had a fall where she injured her neck x-rays were obtained in the emergency department which showed some C-spine degenerative changes but no fractures. He denies any fevers, cough, shortness of breath, leg swelling, recent travel/surgery/immobilization.  She does not use any estrogen she has no personal or family history of DVT or PE, she has no abdominal pain, nausea, vomiting, diarrhea, constipation, dysuria, hematuria, numbness, focal weakness, claudication, orthopnea, headache, vision changes or any other complaints at this time. ED Course: KG unremarkable, chest pain has not recurred, blood pressure elevated which resolved the patient was settled in the emergency department.   Hospital Course by Problem:   Chest pain -CE negative -stress test:   Nonspecific T wave abnormalities seen throughout study with late R wave transition.  Global reduction in radiotracer uptake during stress which appears due to variable soft tissue attenuation (particularly in inferior, inferoapical, and anteroapical walls; likely breast attenuation). Regional wall motion is normal, indicative of defects being due to artifact.  Nuclear stress EF: 63%.  This is a low risk study  Cervical spine degenerative changes: - may have some brachial plexus issues due to injury on December 27.  Would recommend she follow-up with her primary care physician as this may be the source of her chest discomfort. -trial of robaxin  Medical Consultants:      Discharge Exam:   Vitals:   02/03/18 1030 02/03/18 1147  BP: (!) 156/73 (!) 159/70  Pulse: 79 62  Resp:  18  Temp:   98.1 F (36.7 C)  SpO2:  98%   Vitals:   02/03/18 1027 02/03/18 1029 02/03/18 1030 02/03/18 1147  BP: (!) 171/77 (!) 158/78 (!) 156/73 (!) 159/70  Pulse: 99 90 79 62  Resp:    18  Temp:    98.1 F (36.7 C)  TempSrc:    Oral  SpO2:    98%  Weight:      Height:        General exam: Appears calm and comfortable.    The results of significant diagnostics from this hospitalization (including imaging, microbiology, ancillary and laboratory) are listed below for reference.     Procedures and Diagnostic Studies:   Dg Chest 2 View  Result Date: 02/02/2018 CLINICAL DATA:  Chest pain EXAM: CHEST - 2 VIEW COMPARISON:  None. FINDINGS: The heart size and mediastinal contours are within normal limits. Both lungs are clear. The visualized skeletal structures are unremarkable. IMPRESSION: No active cardiopulmonary disease. Electronically Signed   By: Franchot Gallo M.D.   On: 02/02/2018 14:28   Dg Cervical Spine Complete  Result Date: 02/02/2018 CLINICAL DATA:  Fall 01/05/2018.  Left neck pain and left arm pain EXAM: CERVICAL SPINE - COMPLETE 4+ VIEW COMPARISON:  None. FINDINGS: Mild anterolisthesis C4-5. Disc degeneration and spurring is mild at C4-5, C5-6, C6-7. Mild foraminal narrowing on the right at C5-6 and C6-7 and on the left at C5-6. Negative for fracture. IMPRESSION: Cervical degenerative change.  Negative for fracture Electronically Signed   By: Franchot Gallo M.D.   On: 02/02/2018 14:30   Nm Myocar Multi W/spect W/wall Motion / Ef  Result Date: 02/03/2018  Nonspecific T wave abnormalities seen throughout study with late R wave transition.  Global reduction in radiotracer uptake during stress which appears due to variable soft tissue attenuation (particularly in inferior, inferoapical, and anteroapical walls; likely breast attenuation). Regional wall motion is normal, indicative of defects being due to artifact.  Nuclear stress EF: 63%.  This is a low risk study.      Labs:    Basic Metabolic Panel: Recent Labs  Lab 02/02/18 1244  NA 140  K 3.7  CL 111  CO2 19*  GLUCOSE 108*  BUN 10  CREATININE 1.16*  CALCIUM 10.2   GFR Estimated Creatinine Clearance: 70 mL/min (A) (by C-G formula based on SCr of 1.16 mg/dL (H)). Liver Function Tests: No results for input(s): AST, ALT, ALKPHOS, BILITOT, PROT, ALBUMIN in the last 168 hours. No results for input(s): LIPASE, AMYLASE in the last 168 hours. No results for input(s): AMMONIA in the last 168 hours. Coagulation profile No results for input(s): INR, PROTIME in the last 168 hours.  CBC: Recent Labs  Lab 02/02/18 1244  WBC 6.4  HGB 14.4  HCT 44.5  MCV 85.2  PLT 302   Cardiac Enzymes: Recent Labs  Lab 02/02/18 1737 02/02/18 2027 02/03/18 0024  TROPONINI <0.03 <0.03 <0.03   BNP: Invalid input(s): POCBNP CBG: No results for input(s): GLUCAP in the last 168 hours. D-Dimer No results for input(s): DDIMER in the  last 72 hours. Hgb A1c No results for input(s): HGBA1C in the last 72 hours. Lipid Profile No results for input(s): CHOL, HDL, LDLCALC, TRIG, CHOLHDL, LDLDIRECT in the last 72 hours. Thyroid function studies No results for input(s): TSH, T4TOTAL, T3FREE, THYROIDAB in the last 72 hours.  Invalid input(s): FREET3 Anemia work up No results for input(s): VITAMINB12, FOLATE, FERRITIN, TIBC, IRON, RETICCTPCT in the last 72 hours. Microbiology No results found for this or any previous visit (from the past 240 hour(s)).   Discharge Instructions:   Discharge Instructions    Diet - low sodium heart healthy   Complete by:  As directed    Discharge instructions   Complete by:  As directed    Bring log of BPs to PCP   Increase activity slowly   Complete by:  As directed      Allergies as of 02/03/2018      Reactions   Cyclobenzaprine Other (See Comments)   Made the patient lethargic      Medication List    STOP taking these medications   loratadine 10 MG tablet Commonly known  as:  CLARITIN   metroNIDAZOLE 500 MG tablet Commonly known as:  FLAGYL     TAKE these medications   ibuprofen 200 MG tablet Commonly known as:  ADVIL Take 2 tablets by mouth every 6-8 hours prn pain. What changed:    how much to take  how to take this  when to take this  reasons to take this  additional instructions   levocetirizine 5 MG tablet Commonly known as:  XYZAL Take 5 mg by mouth daily as needed for allergies.   methocarbamol 500 MG tablet Commonly known as:  ROBAXIN Take 1 tablet (500 mg total) by mouth every 6 (six) hours as needed for muscle spasms.   NASACORT ALLERGY 24HR 55 MCG/ACT Aero nasal inhaler Generic drug:  triamcinolone Place 2 sprays into the nose daily as needed (for congestion).   omeprazole 40 MG capsule Commonly known as:  PRILOSEC Take 40 mg by mouth daily as needed (for reflux or GERD).         Time coordinating discharge: 25 min  Signed:  Geradine Girt DO  [facility] 02/03/2018, 2:48 PM

## 2018-02-03 NOTE — Progress Notes (Signed)
Lexiscan complete. Patient denies any complaints. Patient given snack and beverage. Taken back to [facility] to complete study.

## 2020-05-05 IMAGING — DX DG CHEST 2V
2 series · 2 of 2 positions shown · non-contrast
Comparison: None.

CLINICAL DATA: Chest pain

EXAM:
CHEST - 2 VIEW

[chest pa]
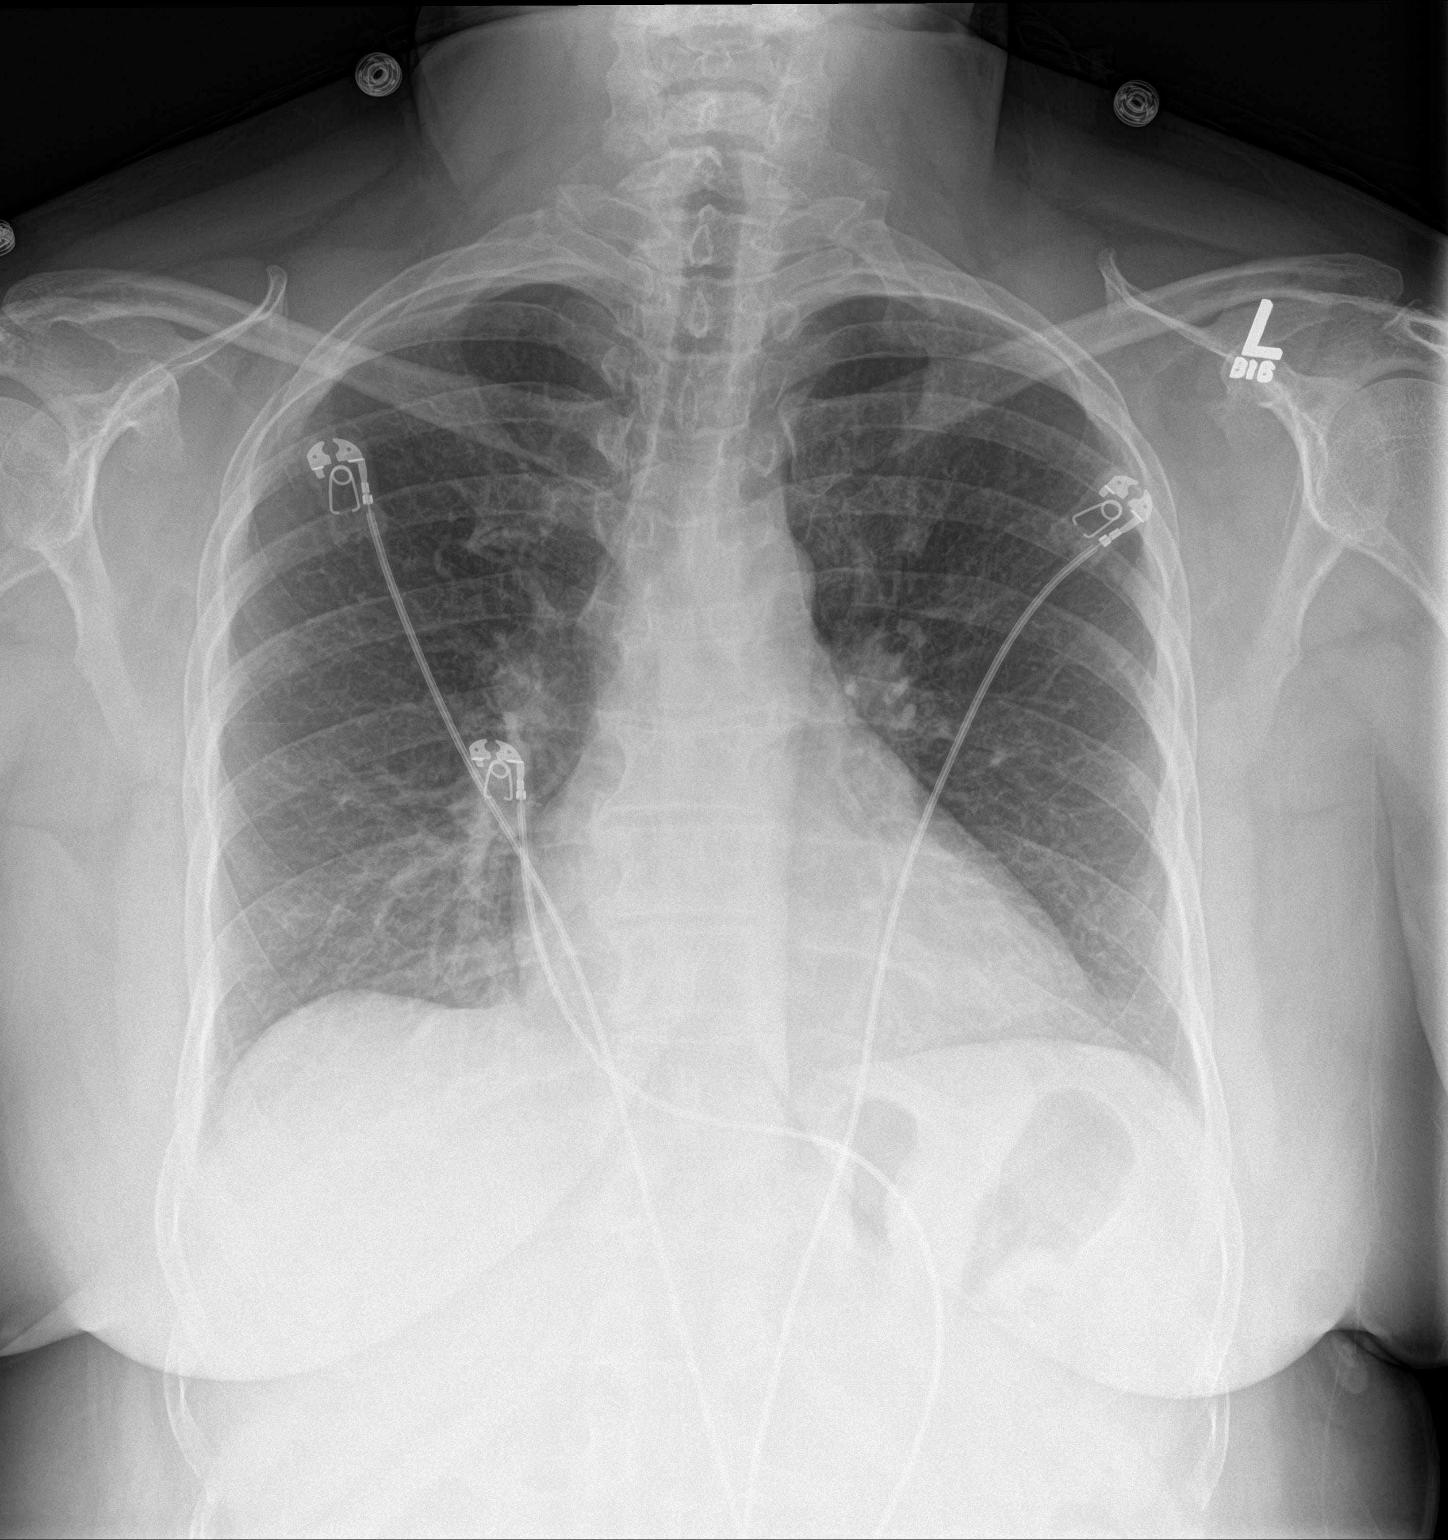

[chest lat]
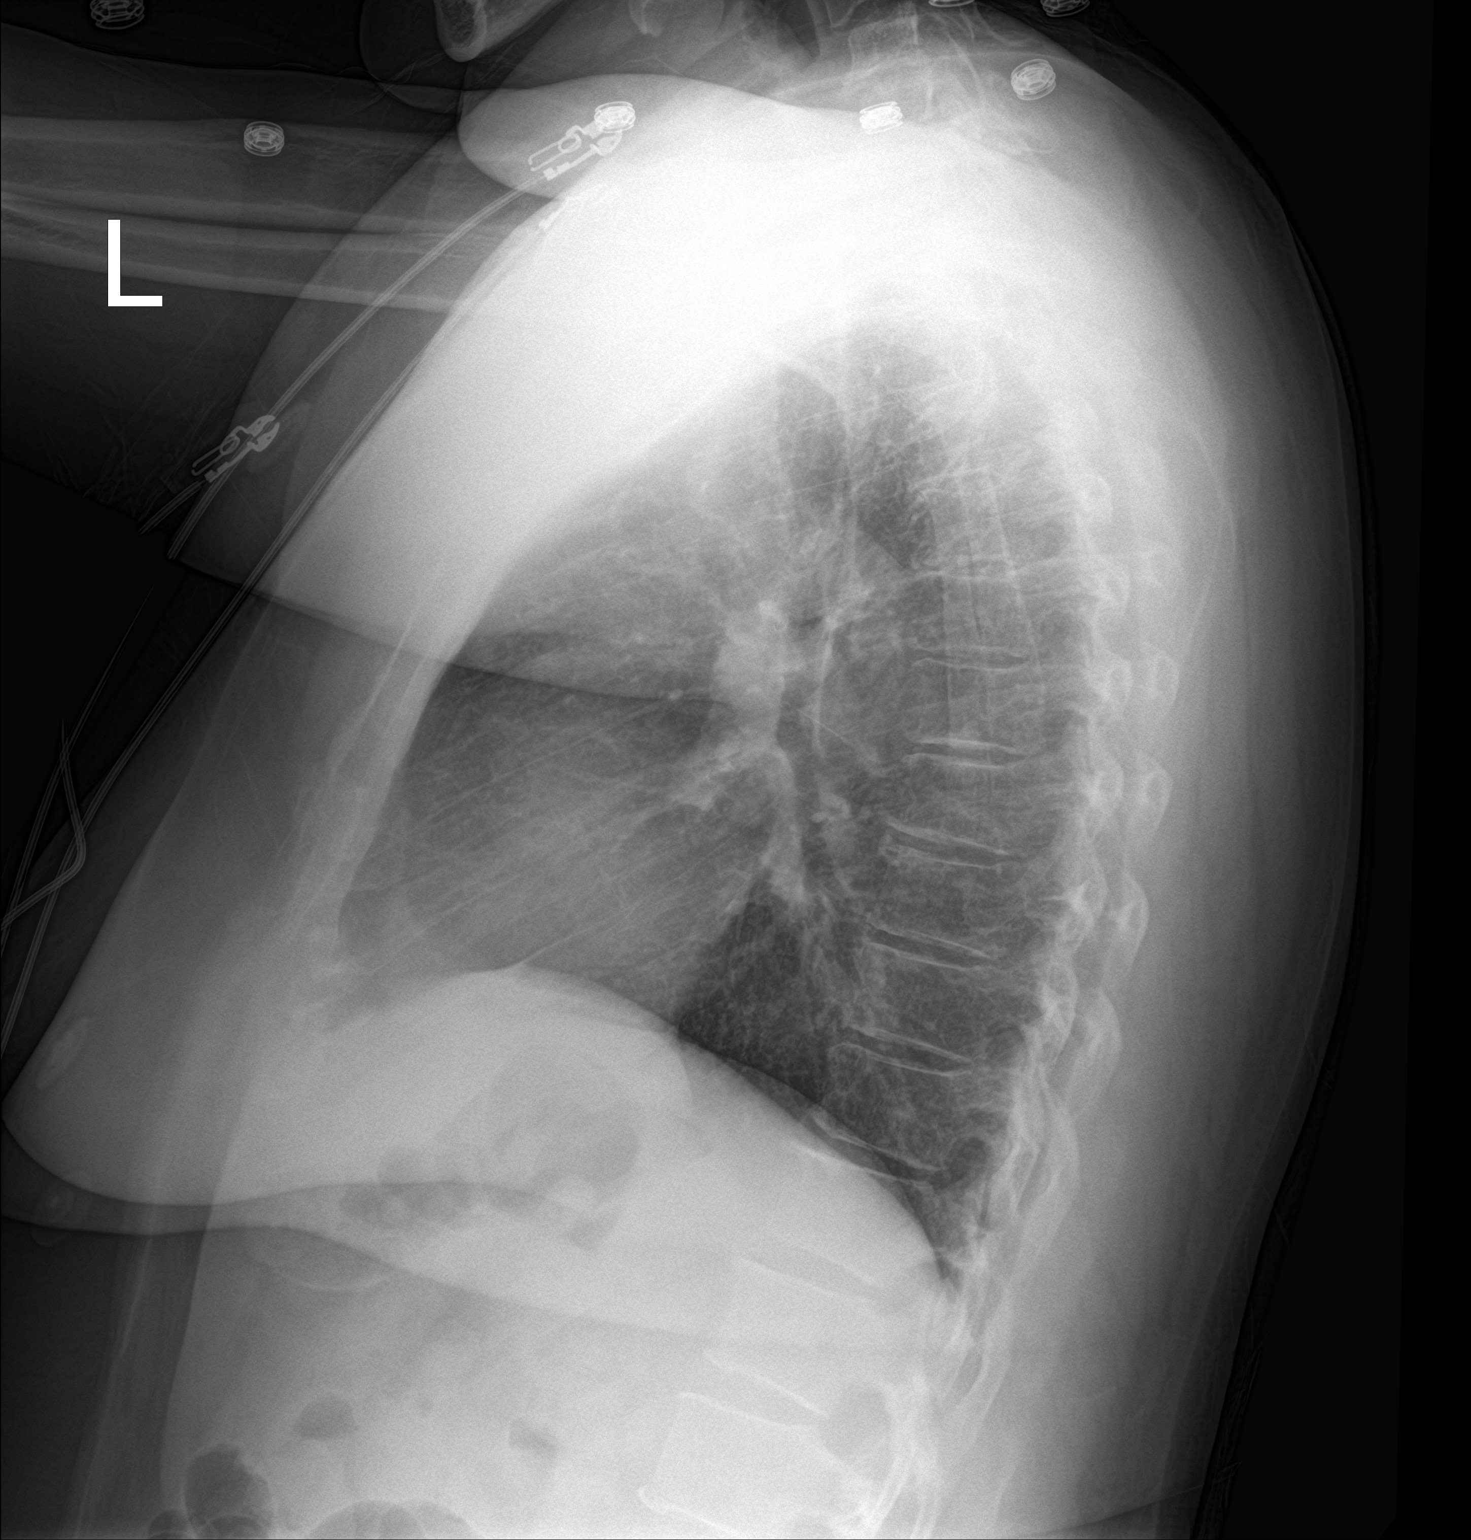

[2 of 2 positions shown; findings below may reference images not displayed]

FINDINGS: The heart size and mediastinal contours are within normal limits.
Both lungs are clear. The visualized skeletal structures are
unremarkable.
IMPRESSION: No active cardiopulmonary disease.

## 2020-05-05 IMAGING — DX DG CERVICAL SPINE COMPLETE 4+V
7 series · 7 of 7 positions shown · non-contrast
Comparison: None.

CLINICAL DATA: Fall 01/05/2018.  Left neck pain and left arm pain

EXAM:
CERVICAL SPINE - COMPLETE 4+ VIEW

[c-spine lat]
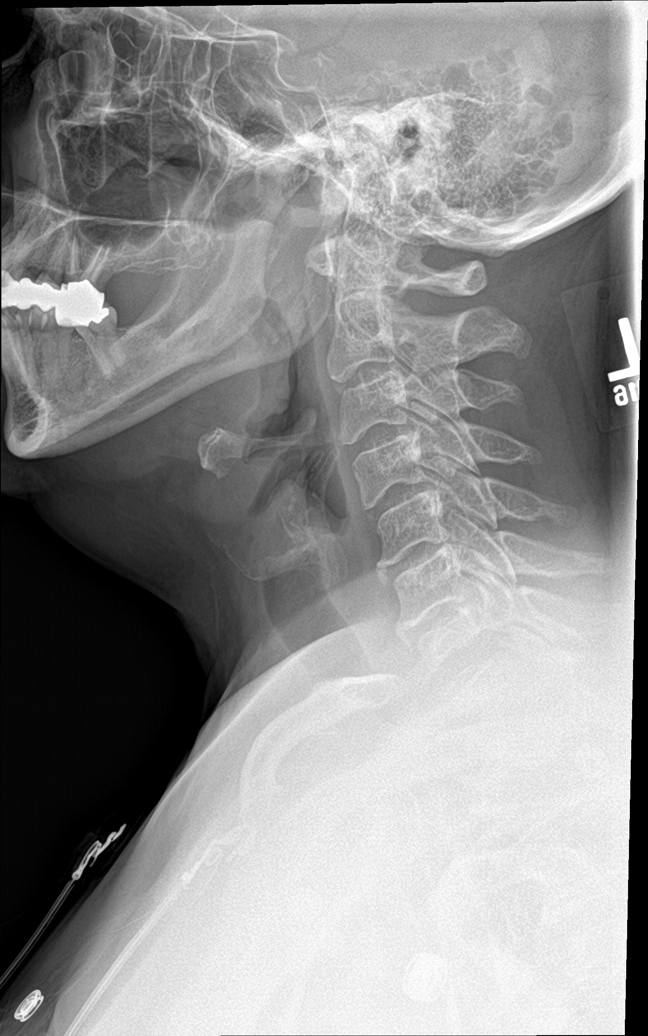

[c-spine obl (1 of 2)]
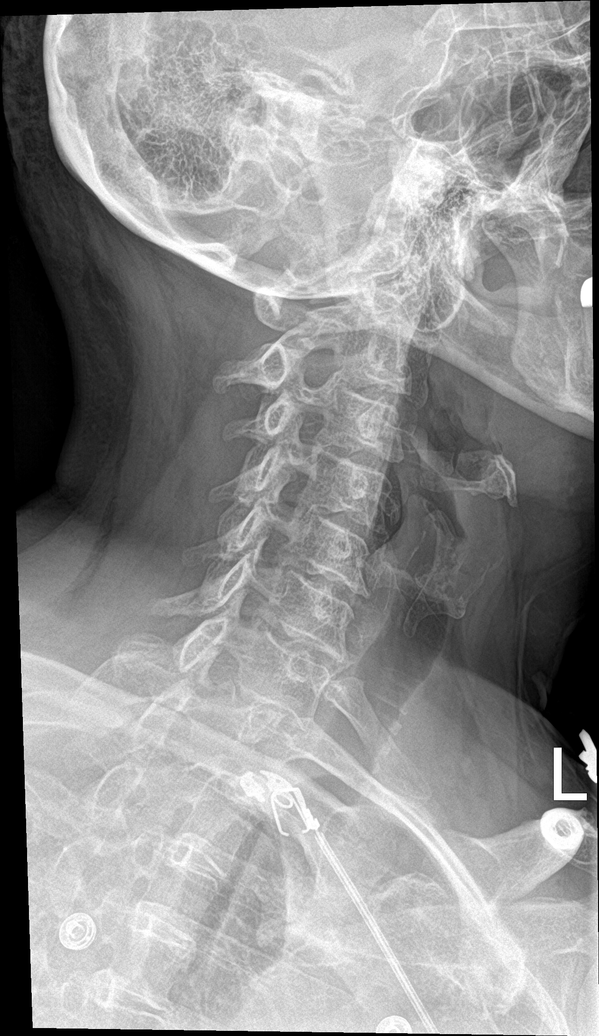

[c-spine obl (2 of 2)]
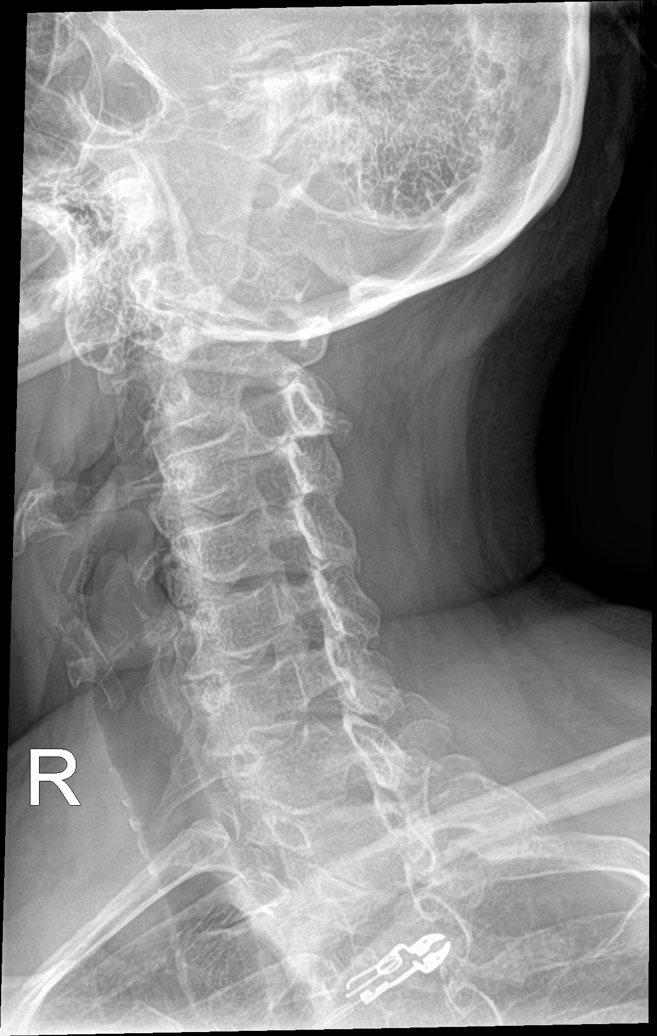

[c-spine ap]
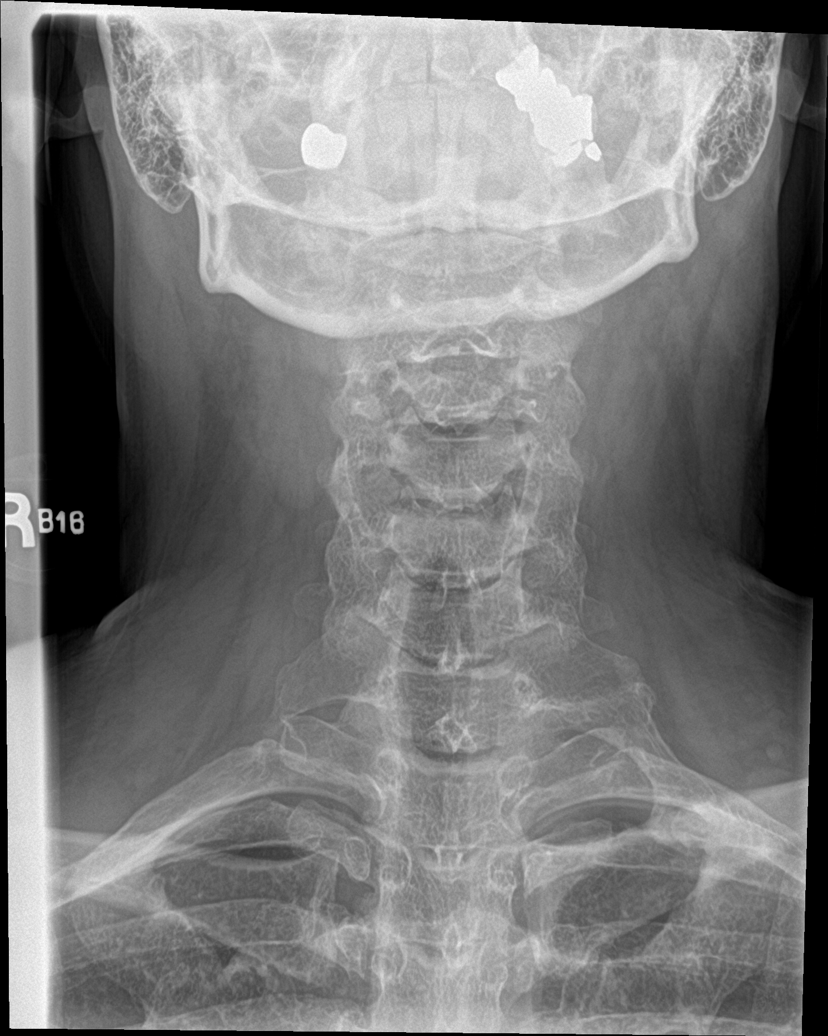

[c-spine swimmers]
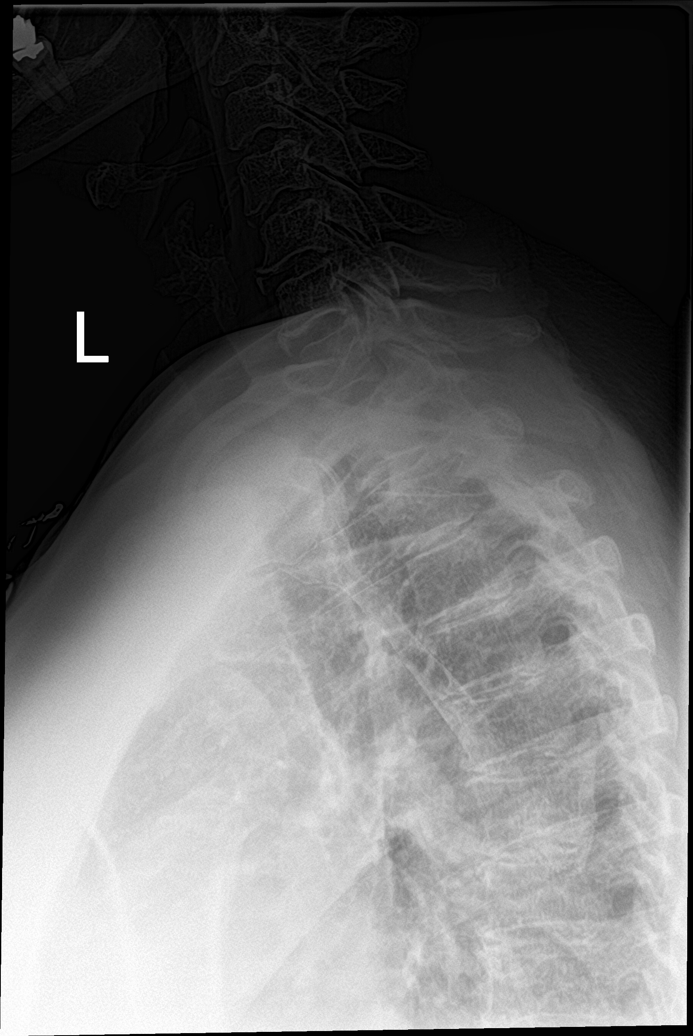

[[person_name]]
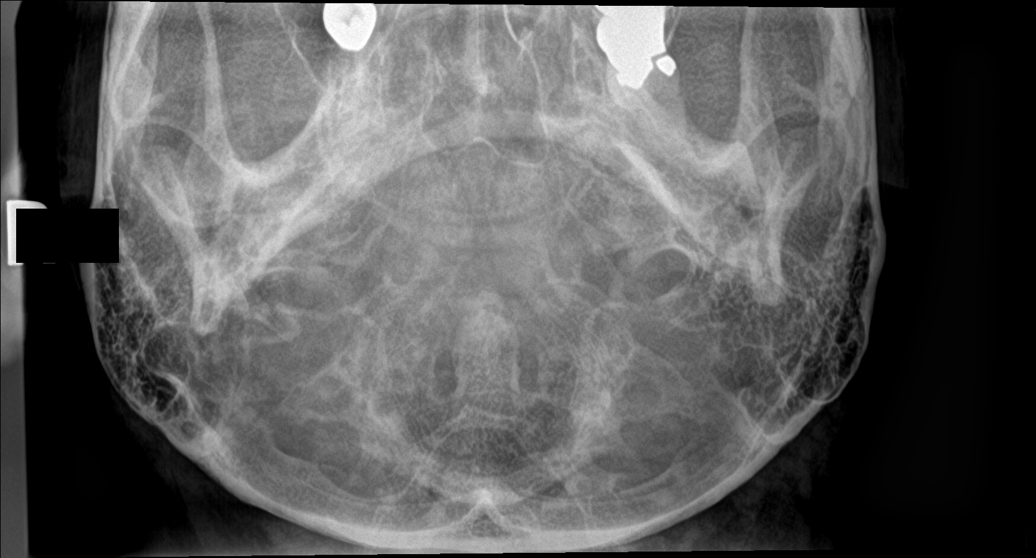

[c-spine open mouth]
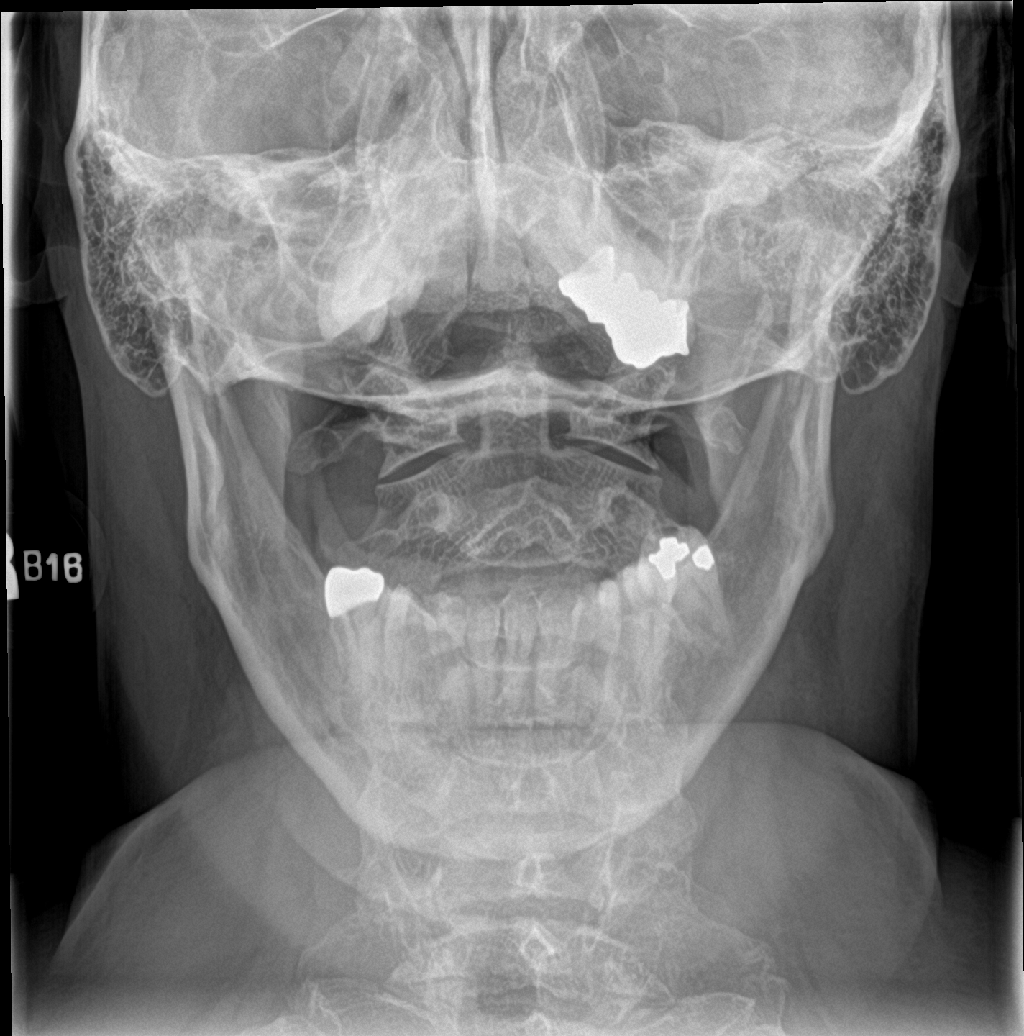

[7 of 7 positions shown; findings below may reference images not displayed]

FINDINGS: Mild anterolisthesis C4-5. Disc degeneration and spurring is mild at
C4-5, C5-6, C6-7. Mild foraminal narrowing on the right at C5-6 and
C6-7 and on the left at C5-6.

Negative for fracture.
IMPRESSION: Cervical degenerative change.  Negative for fracture
